# Patient Record
Sex: Female | Born: 1980 | Hispanic: No | Marital: Married | Smoking: Never smoker
Health system: Southern US, Community
[De-identification: ages and names within clinical notes are randomized; demographics above are authoritative.]

## PROBLEM LIST (undated history)

## (undated) DIAGNOSIS — I10 Essential (primary) hypertension: Secondary | ICD-10-CM

## (undated) DIAGNOSIS — Z87442 Personal history of urinary calculi: Secondary | ICD-10-CM

## (undated) DIAGNOSIS — Z973 Presence of spectacles and contact lenses: Secondary | ICD-10-CM

## (undated) DIAGNOSIS — D649 Anemia, unspecified: Secondary | ICD-10-CM

## (undated) DIAGNOSIS — N200 Calculus of kidney: Secondary | ICD-10-CM

## (undated) HISTORY — DX: Essential (primary) hypertension: I10

---

## 1999-03-26 ENCOUNTER — Other Ambulatory Visit: Admission: RE | Admit: 1999-03-26 | Discharge: 1999-03-26 | Payer: Self-pay | Admitting: Obstetrics & Gynecology

## 1999-08-10 ENCOUNTER — Encounter: Payer: Self-pay | Admitting: *Deleted

## 1999-08-10 ENCOUNTER — Inpatient Hospital Stay (HOSPITAL_COMMUNITY): Admission: AD | Admit: 1999-08-10 | Discharge: 1999-08-10 | Payer: Self-pay | Admitting: *Deleted

## 1999-08-12 ENCOUNTER — Inpatient Hospital Stay (HOSPITAL_COMMUNITY): Admission: AD | Admit: 1999-08-12 | Discharge: 1999-08-12 | Payer: Self-pay | Admitting: Obstetrics

## 1999-08-13 ENCOUNTER — Inpatient Hospital Stay (HOSPITAL_COMMUNITY): Admission: AD | Admit: 1999-08-13 | Discharge: 1999-08-16 | Payer: Self-pay | Admitting: Obstetrics

## 2002-08-04 ENCOUNTER — Emergency Department (HOSPITAL_COMMUNITY): Admission: EM | Admit: 2002-08-04 | Discharge: 2002-08-05 | Payer: Self-pay | Admitting: Emergency Medicine

## 2002-09-21 ENCOUNTER — Encounter: Admission: RE | Admit: 2002-09-21 | Discharge: 2002-09-21 | Payer: Self-pay | Admitting: *Deleted

## 2002-09-22 ENCOUNTER — Ambulatory Visit (HOSPITAL_COMMUNITY): Admission: RE | Admit: 2002-09-22 | Discharge: 2002-09-22 | Payer: Self-pay | Admitting: *Deleted

## 2002-10-05 ENCOUNTER — Encounter: Admission: RE | Admit: 2002-10-05 | Discharge: 2002-10-05 | Payer: Self-pay | Admitting: *Deleted

## 2002-10-26 ENCOUNTER — Encounter: Admission: RE | Admit: 2002-10-26 | Discharge: 2002-10-26 | Payer: Self-pay | Admitting: *Deleted

## 2002-11-16 ENCOUNTER — Encounter: Admission: RE | Admit: 2002-11-16 | Discharge: 2002-11-16 | Payer: Self-pay | Admitting: *Deleted

## 2002-11-16 ENCOUNTER — Inpatient Hospital Stay (HOSPITAL_COMMUNITY): Admission: AD | Admit: 2002-11-16 | Discharge: 2002-11-18 | Payer: Self-pay | Admitting: *Deleted

## 2002-11-19 ENCOUNTER — Inpatient Hospital Stay (HOSPITAL_COMMUNITY): Admission: AD | Admit: 2002-11-19 | Discharge: 2002-11-19 | Payer: Self-pay | Admitting: Obstetrics & Gynecology

## 2002-11-21 ENCOUNTER — Encounter: Admission: RE | Admit: 2002-11-21 | Discharge: 2002-12-21 | Payer: Self-pay | Admitting: *Deleted

## 2007-01-21 ENCOUNTER — Emergency Department (HOSPITAL_COMMUNITY): Admission: EM | Admit: 2007-01-21 | Discharge: 2007-01-21 | Payer: Self-pay | Admitting: Emergency Medicine

## 2010-12-04 LAB — URINALYSIS, ROUTINE W REFLEX MICROSCOPIC
Glucose, UA: NEGATIVE
Ketones, ur: NEGATIVE
pH: 6

## 2010-12-04 LAB — URINE MICROSCOPIC-ADD ON

## 2010-12-04 LAB — POCT PREGNANCY, URINE: Operator id: 27065

## 2010-12-04 LAB — URINE CULTURE

## 2011-12-07 ENCOUNTER — Ambulatory Visit (INDEPENDENT_AMBULATORY_CARE_PROVIDER_SITE_OTHER): Payer: PRIVATE HEALTH INSURANCE | Admitting: Emergency Medicine

## 2011-12-07 VITALS — BP 120/80 | HR 104 | Temp 97.4°F | Resp 16 | Ht 62.0 in | Wt 158.0 lb

## 2011-12-07 DIAGNOSIS — G5600 Carpal tunnel syndrome, unspecified upper limb: Secondary | ICD-10-CM

## 2011-12-07 DIAGNOSIS — R350 Frequency of micturition: Secondary | ICD-10-CM

## 2011-12-07 DIAGNOSIS — N390 Urinary tract infection, site not specified: Secondary | ICD-10-CM

## 2011-12-07 DIAGNOSIS — M549 Dorsalgia, unspecified: Secondary | ICD-10-CM

## 2011-12-07 LAB — POCT UA - MICROSCOPIC ONLY
Casts, Ur, LPF, POC: NEGATIVE
Crystals, Ur, HPF, POC: NEGATIVE
Mucus, UA: POSITIVE
Yeast, UA: NEGATIVE

## 2011-12-07 LAB — POCT URINALYSIS DIPSTICK
Bilirubin, UA: NEGATIVE
Glucose, UA: NEGATIVE
Ketones, UA: NEGATIVE
Nitrite, UA: POSITIVE
Protein, UA: 30
Spec Grav, UA: 1.02
Urobilinogen, UA: 0.2
pH, UA: 7

## 2011-12-07 MED ORDER — MELOXICAM 7.5 MG PO TABS: 7.5000 mg | ORAL_TABLET | Freq: Every day | ORAL | Status: DC

## 2011-12-07 MED ORDER — SULFAMETHOXAZOLE-TRIMETHOPRIM 800-160 MG PO TABS: 1.0000 | ORAL_TABLET | Freq: Two times a day (BID) | ORAL | Status: DC

## 2011-12-07 MED ORDER — PHENAZOPYRIDINE HCL 200 MG PO TABS: 200.0000 mg | ORAL_TABLET | Freq: Three times a day (TID) | ORAL | Status: AC | PRN

## 2011-12-07 NOTE — Patient Instructions (Signed)

## 2011-12-07 NOTE — Progress Notes (Signed)
[facility] [address], [place] [address] [phone]  Date:  12/07/2011   Name:  Erica [name]   DOB:  1981/01/08   MRN:  [id number]  PCP:  No primary provider on file.    Chief Complaint: Headache, Numbness, Back Pain and Urinary Frequency   History of Present Illness:  Erica [name] is a 31 y.o. very pleasant female patient who presents with the following 1. Bilateral hands numb, numbness wakes her up at night worse thumb index and middle fingers also present during the day at rest and while operating machinery.   2. Urinary frequency, dysuria, urgency, and back pain   There is no problem list on file for this patient.   No past medical history on file.  No past surgical history on file.  History  Substance Use Topics  . Smoking status: Never Smoker   . Smokeless tobacco: Not on file  . Alcohol Use: Not on file    No family history on file.  No Known Allergies  Medication list has been reviewed and updated.  No current outpatient prescriptions on file prior to visit.    Review of Systems:  As per HPI, otherwise negative.    Physical Examination: Filed Vitals:   12/07/11 1243  BP: 120/80  Pulse: 104  Temp: 97.4 F (36.3 C)  Resp: 16   Filed Vitals:   12/07/11 1243  Height: 5\' 2"  (1.575 m)  Weight: 158 lb (71.668 kg)   Body mass index is 28.90 kg/(m^2). Ideal Body Weight: Weight in (lb) to have BMI = 25: 136.4   GEN: WDWN, NAD, Non-toxic, Alert & Oriented x 3 HEENT: Atraumatic, Normocephalic.  Ears and Nose: No external deformity. EXTR: No clubbing/cyanosis/edema NEURO: Normal gait.  PSYCH: Normally interactive. Conversant. Not depressed or anxious appearing.  Calm demeanor.  Hands:  Positive tinels and phalens bilateral wrist, no thenar wasting normal grip strength. Urinary frequency and back pain   Assessment and Plan: Carpal tunnel syndrome use night splints take Meloxicam if not better proceed with  nerve studies  Cystitis Septra pyridium  Results for orders placed in visit on 12/07/11  POCT URINALYSIS DIPSTICK      Component Value Range   Color, UA yellow     Clarity, UA cloudy     Glucose, UA neg     Bilirubin, UA neg     Ketones, UA neg     Spec Grav, UA 1.020     Blood, UA mod     pH, UA 7.0     Protein, UA 30     Urobilinogen, UA 0.2     Nitrite, UA pos     Leukocytes, UA moderate (2+)    POCT UA - MICROSCOPIC ONLY      Component Value Range   WBC, Ur, HPF, POC tntc     RBC, urine, microscopic tntc     Bacteria, U Microscopic 4+     Mucus, UA pos     Epithelial cells, urine per micros 4-7     Crystals, Ur, HPF, POC neg     Casts, Ur, LPF, POC neg     Yeast, UA neg       Littrell, Amy Wall, RT as scribe for Dr. Dareen Piano.  I have reviewed and agree with documentation. Robert P. Merla Riches, M.D.

## 2014-10-04 LAB — CYTOLOGY - PAP: Pap: NEGATIVE

## 2016-06-03 ENCOUNTER — Ambulatory Visit (INDEPENDENT_AMBULATORY_CARE_PROVIDER_SITE_OTHER): Payer: Managed Care, Other (non HMO) | Admitting: Family Medicine

## 2016-06-03 ENCOUNTER — Encounter: Payer: Self-pay | Admitting: Family Medicine

## 2016-06-03 ENCOUNTER — Encounter (HOSPITAL_COMMUNITY): Payer: Self-pay

## 2016-06-03 VITALS — BP 125/79 | HR 84 | Ht 66.0 in | Wt 165.1 lb

## 2016-06-03 DIAGNOSIS — Z302 Encounter for sterilization: Secondary | ICD-10-CM | POA: Diagnosis not present

## 2016-06-03 NOTE — Patient Instructions (Signed)
Laparoscopic Tubal Ligation Laparoscopic tubal ligation is a procedure to close the fallopian tubes. This is done so that you cannot get pregnant. When the fallopian tubes are closed, the eggs that your ovaries release cannot enter the uterus, and sperm cannot reach the released eggs. A laparoscopic tubal ligation is sometimes called "getting your tubes tied." You should not have this procedure if you want to get pregnant someday or if you are unsure about having more children. Tell a health care provider about:  Any allergies you have.  All medicines you are taking, including vitamins, herbs, eye drops, creams, and over-the-counter medicines.  Any problems you or family members have had with anesthetic medicines.  Any blood disorders you have.  Any surgeries you have had.  Any medical conditions you have.  Whether you are pregnant or may be pregnant.  Any past pregnancies. What are the risks? Generally, this is a safe procedure. However, problems may occur, including:  Infection.  Bleeding.  Injury to surrounding organs.  Side effects from anesthetics.  Failure of the procedure. This procedure can increase your risk of a kind of pregnancy in which a fertilized egg attaches to the outside of the uterus (ectopic pregnancy). What happens before the procedure?  Ask your health care provider about:  Changing or stopping your regular medicines. This is especially important if you are taking diabetes medicines or blood thinners.  Taking medicines such as aspirin and ibuprofen. These medicines can thin your blood. Do not take these medicines before your procedure if your health care provider instructs you not to.  Follow instructions from your health care provider about eating and drinking restrictions.  Plan to have someone take you home after the procedure.  If you go home right after the procedure, plan to have someone with you for 24 hours. What happens during the  procedure?  You will be given one or more of the following:  A medicine to help you relax (sedative).  A medicine to numb the area (local anesthetic).  A medicine to make you fall asleep (general anesthetic).  A medicine that is injected into an area of your body to numb everything below the injection site (regional anesthetic).  An IV tube will be inserted into one of your veins. It will be used to give you medicines and fluids during the procedure.  Your bladder may be emptied with a small tube (catheter).  If you have been given a general anesthetic, a tube will be put down your throat to help you breathe.  Two small cuts (incisions) will be made in your lower abdomen and near your belly button.  Your abdomen will be inflated with a gas. This will let the surgeon see better and will give the surgeon room to work.  A thin, lighted tube (laparoscope) with a camera attached will be inserted into your abdomen through one of the incisions. Small instruments will be inserted through the other incision.  The fallopian tubes will be tied off, burned (cauterized), or blocked with a clip, ring, or clamp. A small portion in the center of each fallopian tube may be removed.  The gas will be released from the abdomen.  The incisions will be closed with stitches (sutures).  A bandage (dressing) will be placed over the incisions. The procedure may vary among health care providers and hospitals. What happens after the procedure?  Your blood pressure, heart rate, breathing rate, and blood oxygen level will be monitored often until the medicines you were   given have worn off.  You will be given medicine to help with pain, nausea, and vomiting as needed. This information is not intended to replace advice given to you by your health care provider. Make sure you discuss any questions you have with your health care provider. Document Released: 05/20/2000 Document Revised: 07/20/2015 Document  Reviewed: 01/22/2015 Elsevier Interactive Patient Education  2017 Elsevier Inc.  

## 2016-06-03 NOTE — Progress Notes (Signed)
   Subjective:    Patient ID: Erica [name] is a 36 y.o. female presenting with Contraception  on 06/03/2016  HPI: Patient is referred for permanent sterilization. Options of LARCs reviewed and patient does desire permanent sterilization. Past Medical History:  Diagnosis Date  . Hypertension    History reviewed. No pertinent surgical history. No current outpatient prescriptions on file prior to visit.   No current facility-administered medications on file prior to visit.    No Known Allergies Social History   Social History  . Marital status: Married    Spouse name: N/A  . Number of children: N/A  . Years of education: N/A   Occupational History  . Not on file.   Social History Main Topics  . Smoking status: Never Smoker  . Smokeless tobacco: Never Used  . Alcohol use No  . Drug use: No  . Sexual activity: Yes    Birth control/ protection: Injection   Other Topics Concern  . Not on file   Social History Narrative  . No narrative on file   History reviewed. No pertinent family history.   Review of Systems  Constitutional: Negative for chills and fever.  Respiratory: Negative for shortness of breath.   Cardiovascular: Negative for chest pain.  Gastrointestinal: Negative for abdominal pain, nausea and vomiting.  Genitourinary: Negative for dysuria.  Skin: Negative for rash.      Objective:    BP 125/79   Pulse 84   Ht  (1.676 m)   Wt 165 lb 1.6 oz (74.9 kg)   BMI 26.65 kg/m  Physical Exam  Constitutional: She is oriented to person, place, and time. She appears well-developed and well-nourished. No distress.  HENT:  Head: Normocephalic and atraumatic.  Eyes: No scleral icterus.  Neck: Neck supple.  Cardiovascular: Normal rate.   Pulmonary/Chest: Effort normal.  Abdominal: Soft.  Neurological: She is alert and oriented to person, place, and time.  Skin: Skin is warm and dry.  Psychiatric: She has a normal mood and affect.          Assessment & Plan:  Encounter for sterilization - For lap sterilization--discussed getting salpingectomy for ovarian cancer prevention if insurance covers, she prefers this method if possible.  Risks include but are not limited to bleeding, infection, injury to surrounding structures, including bowel, bladder and ureters, blood clots, and death.  Likelihood of success is high.  Patient counseled, r.e. Risks benefits of BTL, including permanency of procedure, risk of failure(1:100), increased risk of ectopic.  Patient verbalized understanding and desires to proceed  Total face-to-face time with patient: 25 minutes. Over 50% of encounter was spent on counseling and coordination of care. Return in about 8 weeks (around 07/29/2016) for postop check.  Reva Bores 06/03/2016 3:53 PM

## 2016-07-30 DIAGNOSIS — Z302 Encounter for sterilization: Secondary | ICD-10-CM

## 2016-07-30 NOTE — H&P (Signed)
  Erica [name] is an 36 y.o. [phone] female.   Chief Complaint: desires sterility HPI: for permanent sterilization, desires salpingectomy as form of sterilization, due to decreasing risk of ovarian cancer.  Past Medical History:  Diagnosis Date  . Hypertension     No past surgical history on file.  No family history on file. Social History:  reports that she has never smoked. She has never used smokeless tobacco. She reports that she does not drink alcohol or use drugs.  Allergies: No Known Allergies  No current facility-administered medications on file prior to encounter.    No current outpatient prescriptions on file prior to encounter.    A comprehensive review of systems was negative.  There were no vitals taken for this visit. General appearance: alert, cooperative and appears stated age Head: Normocephalic, without obvious abnormality, atraumatic Neck: supple, symmetrical, trachea midline Lungs: normal effort Heart: regular rate and rhythm Abdomen: soft, non-tender; bowel sounds normal; no masses,  no organomegaly Extremities: extremities normal, atraumatic, no cyanosis or edema Skin: Skin color, texture, turgor normal. No rashes or lesions Neurologic: Grossly normal   No results found for: WBC, HGB, HCT, MCV, PLT No results found for: PREGTESTUR, PREGSERUM, HCG, HCGQUANT   Assessment/Plan Principal Problem:   Encounter for sterilization  For laparoscopic salpingectomy Risks include but are not limited to bleeding, infection, injury to surrounding structures, including bowel, bladder and ureters, blood clots, and death.  Likelihood of success is high.    Erica [name] 07/30/2016, 9:01 AM

## 2016-08-02 NOTE — Patient Instructions (Addendum)
Your procedure is scheduled on:  Monday, June 18  [facility] through the [facility]tr[facility] at: 8:10 am  Pick up the phone at the desk and dial [phone].  Call this number if you have problems the morning of surgery: [phone].  Remember: Do NOT eat or drink (including water) after midnight Sunday  Take these medicines the morning of surgery with a SIP OF WATER:  None  Do not smoke on day of surgery.  Stop all herbal medications at this time.  Do NOT wear jewelry (body piercing), metal hair clips/bobby pins, make-up, or nail polish. Do NOT wear lotions, powders, or perfumes.  You may wear deoderant. Do NOT shave for 48 hours prior to surgery. Do NOT bring valuables to the hospital..  Have a responsible adult drive you home and stay with you for 24 hours after your procedure. Home with -to be arranged prior to surgery.  Patient informed can not use uber, taxi or bus to go home.

## 2016-08-07 ENCOUNTER — Encounter (HOSPITAL_COMMUNITY)
Admission: RE | Admit: 2016-08-07 | Discharge: 2016-08-07 | Disposition: A | Discharge status: Home / Self Care | Payer: Managed Care, Other (non HMO) | Source: Ambulatory Visit | Attending: Family Medicine | Admitting: Family Medicine

## 2016-08-07 ENCOUNTER — Encounter (HOSPITAL_COMMUNITY): Payer: Self-pay

## 2016-08-07 DIAGNOSIS — Z01812 Encounter for preprocedural laboratory examination: Secondary | ICD-10-CM | POA: Insufficient documentation

## 2016-08-07 HISTORY — DX: Anemia, unspecified: D64.9

## 2016-08-07 LAB — CBC
HCT: 35.2 % — ABNORMAL LOW (ref 36.0–46.0)
Hemoglobin: 11.6 g/dL — ABNORMAL LOW (ref 12.0–15.0)
MCH: 25.7 pg — ABNORMAL LOW (ref 26.0–34.0)
MCHC: 33 g/dL (ref 30.0–36.0)
MCV: 78 fL (ref 78.0–100.0)
PLATELETS: 262 10*3/uL (ref 150–400)
RBC: 4.51 MIL/uL (ref 3.87–5.11)
RDW: 13.4 % (ref 11.5–15.5)
WBC: 6.6 10*3/uL (ref 4.0–10.5)

## 2016-08-12 ENCOUNTER — Ambulatory Visit (HOSPITAL_COMMUNITY): Payer: Managed Care, Other (non HMO) | Admitting: Anesthesiology

## 2016-08-12 ENCOUNTER — Encounter: Payer: Self-pay | Admitting: *Deleted

## 2016-08-12 ENCOUNTER — Encounter (HOSPITAL_COMMUNITY): Payer: Self-pay

## 2016-08-12 ENCOUNTER — Ambulatory Visit (HOSPITAL_COMMUNITY)
Admission: RE | Admit: 2016-08-12 | Discharge: 2016-08-12 | Disposition: A | Discharge status: Home / Self Care | Payer: Managed Care, Other (non HMO) | Source: Ambulatory Visit | Attending: Family Medicine | Admitting: Family Medicine

## 2016-08-12 ENCOUNTER — Encounter (HOSPITAL_COMMUNITY)
Admission: RE | Disposition: A | Discharge status: Home / Self Care | Payer: Self-pay | Source: Ambulatory Visit | Attending: Family Medicine

## 2016-08-12 DIAGNOSIS — Z302 Encounter for sterilization: Secondary | ICD-10-CM | POA: Diagnosis not present

## 2016-08-12 HISTORY — PX: LAPAROSCOPIC BILATERAL SALPINGECTOMY: SHX5889

## 2016-08-12 LAB — PREGNANCY, URINE: Preg Test, Ur: NEGATIVE

## 2016-08-12 SURGERY — SALPINGECTOMY, BILATERAL, LAPAROSCOPIC
Anesthesia: General | Site: Abdomen | Laterality: Bilateral

## 2016-08-12 MED ORDER — LACTATED RINGERS IV SOLN
INTRAVENOUS | Status: DC
  Administered 2016-08-12: 125 mL/h via INTRAVENOUS
  Administered 2016-08-12: 10:00:00 via INTRAVENOUS

## 2016-08-12 MED ORDER — ROCURONIUM BROMIDE 100 MG/10ML IV SOLN
INTRAVENOUS | Status: DC | PRN
  Administered 2016-08-12: 50 mg via INTRAVENOUS

## 2016-08-12 MED ORDER — PROPOFOL 10 MG/ML IV BOLUS
INTRAVENOUS | Status: AC
  Filled 2016-08-12: qty 20

## 2016-08-12 MED ORDER — ONDANSETRON HCL 4 MG/2ML IJ SOLN
INTRAMUSCULAR | Status: DC | PRN
  Administered 2016-08-12: 4 mg via INTRAVENOUS

## 2016-08-12 MED ORDER — LIDOCAINE HCL (CARDIAC) 20 MG/ML IV SOLN
INTRAVENOUS | Status: AC
  Filled 2016-08-12: qty 5

## 2016-08-12 MED ORDER — BUPIVACAINE HCL (PF) 0.25 % IJ SOLN
INTRAMUSCULAR | Status: DC | PRN
  Administered 2016-08-12: 10 mL

## 2016-08-12 MED ORDER — MIDAZOLAM HCL 2 MG/2ML IJ SOLN
INTRAMUSCULAR | Status: DC | PRN
  Administered 2016-08-12: 2 mg via INTRAVENOUS

## 2016-08-12 MED ORDER — FENTANYL CITRATE (PF) 100 MCG/2ML IJ SOLN
INTRAMUSCULAR | Status: DC | PRN
  Administered 2016-08-12: 150 ug via INTRAVENOUS
  Administered 2016-08-12: 100 ug via INTRAVENOUS

## 2016-08-12 MED ORDER — MEPERIDINE HCL 25 MG/ML IJ SOLN: 6.2500 mg | INTRAMUSCULAR | Status: DC | PRN

## 2016-08-12 MED ORDER — SUGAMMADEX SODIUM 200 MG/2ML IV SOLN
INTRAVENOUS | Status: AC
Start: 2016-08-12 — End: ?
  Filled 2016-08-12: qty 2

## 2016-08-12 MED ORDER — LACTATED RINGERS IV SOLN: INTRAVENOUS | Status: DC

## 2016-08-12 MED ORDER — FENTANYL CITRATE (PF) 250 MCG/5ML IJ SOLN
INTRAMUSCULAR | Status: AC
  Filled 2016-08-12: qty 5

## 2016-08-12 MED ORDER — SUGAMMADEX SODIUM 200 MG/2ML IV SOLN
INTRAVENOUS | Status: DC | PRN
  Administered 2016-08-12: 200 mg via INTRAVENOUS

## 2016-08-12 MED ORDER — OXYCODONE-ACETAMINOPHEN 5-325 MG PO TABS: 1.0000 | ORAL_TABLET | Freq: Four times a day (QID) | ORAL | 0 refills | Status: DC | PRN

## 2016-08-12 MED ORDER — DEXAMETHASONE SODIUM PHOSPHATE 10 MG/ML IJ SOLN
INTRAMUSCULAR | Status: DC | PRN
  Administered 2016-08-12: 4 mg via INTRAVENOUS

## 2016-08-12 MED ORDER — SCOPOLAMINE 1 MG/3DAYS TD PT72
MEDICATED_PATCH | TRANSDERMAL | Status: AC
  Administered 2016-08-12: 1.5 mg via TRANSDERMAL
  Filled 2016-08-12: qty 1

## 2016-08-12 MED ORDER — FENTANYL CITRATE (PF) 100 MCG/2ML IJ SOLN: 25.0000 ug | INTRAMUSCULAR | Status: DC | PRN

## 2016-08-12 MED ORDER — IBUPROFEN 600 MG PO TABS: 600.0000 mg | ORAL_TABLET | Freq: Four times a day (QID) | ORAL | 1 refills | Status: DC | PRN

## 2016-08-12 MED ORDER — ONDANSETRON HCL 4 MG/2ML IJ SOLN
INTRAMUSCULAR | Status: AC
  Filled 2016-08-12: qty 2

## 2016-08-12 MED ORDER — KETOROLAC TROMETHAMINE 30 MG/ML IJ SOLN
INTRAMUSCULAR | Status: DC | PRN
  Administered 2016-08-12: 30 mg via INTRAVENOUS

## 2016-08-12 MED ORDER — SCOPOLAMINE 1 MG/3DAYS TD PT72
1.0000 | MEDICATED_PATCH | Freq: Once | TRANSDERMAL | Status: DC
  Administered 2016-08-12: 1.5 mg via TRANSDERMAL

## 2016-08-12 MED ORDER — PROPOFOL 10 MG/ML IV BOLUS
INTRAVENOUS | Status: DC | PRN
  Administered 2016-08-12: 150 mg via INTRAVENOUS

## 2016-08-12 MED ORDER — LIDOCAINE HCL (CARDIAC) 20 MG/ML IV SOLN
INTRAVENOUS | Status: DC | PRN
  Administered 2016-08-12: 50 mg via INTRAVENOUS

## 2016-08-12 MED ORDER — KETOROLAC TROMETHAMINE 30 MG/ML IJ SOLN
INTRAMUSCULAR | Status: AC
  Filled 2016-08-12: qty 1

## 2016-08-12 MED ORDER — BUPIVACAINE HCL (PF) 0.25 % IJ SOLN
INTRAMUSCULAR | Status: AC
  Filled 2016-08-12: qty 30

## 2016-08-12 MED ORDER — DEXAMETHASONE SODIUM PHOSPHATE 4 MG/ML IJ SOLN
INTRAMUSCULAR | Status: AC
  Filled 2016-08-12: qty 1

## 2016-08-12 MED ORDER — MIDAZOLAM HCL 2 MG/2ML IJ SOLN
INTRAMUSCULAR | Status: AC
  Filled 2016-08-12: qty 2

## 2016-08-12 MED ORDER — METOCLOPRAMIDE HCL 5 MG/ML IJ SOLN: 10.0000 mg | Freq: Once | INTRAMUSCULAR | Status: DC | PRN

## 2016-08-12 MED ORDER — FENTANYL CITRATE (PF) 100 MCG/2ML IJ SOLN
INTRAMUSCULAR | Status: AC
  Filled 2016-08-12: qty 2

## 2016-08-12 SURGICAL SUPPLY — 32 items
APPLICATOR COTTON TIP 6IN STRL (MISCELLANEOUS) ×2 IMPLANT
BAG SPEC RTRVL LRG 6X4 10 (ENDOMECHANICALS)
BLADE SURG 15 STRL LF C SS BP (BLADE) ×1 IMPLANT
BLADE SURG 15 STRL SS (BLADE) ×3
CABLE HIGH FREQUENCY MONO STRZ (ELECTRODE) IMPLANT
CATH ROBINSON RED A/P 16FR (CATHETERS) ×2 IMPLANT
CLOTH BEACON ORANGE TIMEOUT ST (SAFETY) ×3 IMPLANT
DRSG OPSITE POSTOP 3X4 (GAUZE/BANDAGES/DRESSINGS) IMPLANT
DURAPREP 26ML APPLICATOR (WOUND CARE) ×3 IMPLANT
GLOVE BIOGEL PI IND STRL 7.0 (GLOVE) ×4 IMPLANT
GLOVE BIOGEL PI INDICATOR 7.0 (GLOVE) ×8
GLOVE ECLIPSE 7.0 STRL STRAW (GLOVE) ×3 IMPLANT
GOWN STRL REUS W/TWL LRG LVL3 (GOWN DISPOSABLE) ×9 IMPLANT
NS IRRIG 1000ML POUR BTL (IV SOLUTION) ×3 IMPLANT
PACK LAPAROSCOPY BASIN (CUSTOM PROCEDURE TRAY) ×3 IMPLANT
PACK TRENDGUARD 450 HYBRID PRO (MISCELLANEOUS) IMPLANT
PACK TRENDGUARD 600 HYBRD PROC (MISCELLANEOUS) IMPLANT
POUCH SPECIMEN RETRIEVAL 10MM (ENDOMECHANICALS) IMPLANT
PROTECTOR NERVE ULNAR (MISCELLANEOUS) ×6 IMPLANT
SET IRRIG TUBING LAPAROSCOPIC (IRRIGATION / IRRIGATOR) IMPLANT
SHEARS HARMONIC ACE PLUS 36CM (ENDOMECHANICALS) ×2 IMPLANT
SLEEVE XCEL OPT CAN 5 100 (ENDOMECHANICALS) ×3 IMPLANT
SUT VIC AB 3-0 X1 27 (SUTURE) ×3 IMPLANT
SUT VICRYL 0 UR6 27IN ABS (SUTURE) ×6 IMPLANT
TOWEL OR 17X24 6PK STRL BLUE (TOWEL DISPOSABLE) ×6 IMPLANT
TRAY FOLEY CATH SILVER 14FR (SET/KITS/TRAYS/PACK) ×3 IMPLANT
TRENDGUARD 450 HYBRID PRO PACK (MISCELLANEOUS)
TRENDGUARD 600 HYBRID PROC PK (MISCELLANEOUS)
TROCAR BALLN 12MMX100 BLUNT (TROCAR) IMPLANT
TROCAR XCEL NON-BLD 5MMX100MML (ENDOMECHANICALS) ×3 IMPLANT
TUBING INSUFFLATION 10FT LAP (TUBING) ×2 IMPLANT
WARMER LAPAROSCOPE (MISCELLANEOUS) ×3 IMPLANT

## 2016-08-12 NOTE — Transfer of Care (Signed)
Immediate Anesthesia Transfer of Care Note  Patient: Erica [name]  Procedure(s) Performed: Procedure(s): LAPAROSCOPIC BILATERAL SALPINGECTOMY (Bilateral)  Patient Location: PACU  Anesthesia Type:General  Level of Consciousness: awake, alert  and oriented  Airway & Oxygen Therapy: Patient Spontanous Breathing and Patient connected to nasal cannula oxygen  Post-op Assessment: Report given to RN and Post -op Vital signs reviewed and stable  Post vital signs: Reviewed and stable  Last Vitals:  Vitals:   08/12/16 0811  BP: (!) 130/100  Pulse: 90  Resp: 16  Temp: 36.9 C    Last Pain:  Vitals:   08/12/16 0811  TempSrc: Oral      Patients Stated Pain Goal: 4 (08/12/16 0811)  Complications: No apparent anesthesia complications

## 2016-08-12 NOTE — Progress Notes (Unsigned)
Per [employer], no prior authorization is required for cpt #58700.

## 2016-08-12 NOTE — Discharge Instructions (Signed)
DISCHARGE INSTRUCTIONS: Laparoscopy  The following instructions have been prepared to help you care for yourself upon your return home today.  Wound care:  Do not get the incision wet for the first 24 hours. The incision should be kept clean and dry.  The Band-Aids or dressings may be removed the day after surgery.  Should the incision become sore, red, and swollen after the first week, check with your doctor.  Personal hygiene:  Shower the day after your procedure.  Activity and limitations:  Do NOT drive or operate any equipment today.  Do NOT lift anything more than 15 pounds for 2-3 weeks after surgery.  Do NOT rest in bed all day.  Walking is encouraged. Walk each day, starting slowly with 5-minute walks 3 or 4 times a day. Slowly increase the length of your walks.  Walk up and down stairs slowly.  Do NOT do strenuous activities, such as golfing, playing tennis, bowling, running, biking, weight lifting, gardening, mowing, or vacuuming for 2-4 weeks. Ask your doctor when it is okay to start.  Diet: Eat a light meal as desired this evening. You may resume your usual diet tomorrow.  Return to work: This is dependent on the type of work you do. For the most part you can return to a desk job within a week of surgery. If you are more active at work, please discuss this with your doctor.  What to expect after your surgery: You may have a slight burning sensation when you urinate on the first day. You may have a very small amount of blood in the urine. Expect to have a small amount of vaginal discharge/light bleeding for 1-2 weeks. It is not unusual to have abdominal soreness and bruising for up to 2 weeks. You may be tired and need more rest for about 1 week. You may experience shoulder pain for 24-72 hours. Lying flat in bed may relieve it.  Call your doctor for any of the following:  Develop a fever of 100.4 or greater  Inability to urinate 6 hours after discharge from  hospital  Severe pain not relieved by pain medications  Persistent of heavy bleeding at incision site  Redness or swelling around incision site after a week  Increasing nausea or vomiting  Patient Signature________________________________________ Nurse Signature_________________________________________Laparoscopic Tubal Ligation, Care After Refer to this sheet in the next few weeks. These instructions provide you with information about caring for yourself after your procedure. Your health care provider may also give you more specific instructions. Your treatment has been planned according to current medical practices, but problems sometimes occur. Call your health care provider if you have any problems or questions after your procedure. What can I expect after the procedure? After the procedure, it is common to have:  A sore throat.  Discomfort in your shoulder.  Mild discomfort or cramping in your abdomen.  Gas pains.  Pain or soreness in the area where the surgical cut (incision) was made.  A bloated feeling.  Tiredness.  Nausea.  Vomiting.  Follow these instructions at home: Medicines  Take over-the-counter and prescription medicines only as told by your health care provider.  Do not take aspirin because it can cause bleeding.  Do not drive or operate heavy machinery while taking prescription pain medicine-Percocet. Activity  Rest for the rest of the day.  Return to your normal activities as told by your health care provider in 2-3 days as you feel up to it. Ask your health care provider what  activities are safe for you. Incision care   Follow instructions from your health care provider about how to take care of your incision. Make sure you: ? Wash your hands with soap and water before you change your bandage (dressing). If soap and water are not available, use hand sanitizer. ? Change your dressing as told by your health care provider. ? Leave stitches  (sutures) in place. They may need to stay in place for 2 weeks or longer.  Check your incision area every day for signs of infection. Check for: ? More redness, swelling, or pain. ? More fluid or blood. ? Warmth. ? Pus or a bad smell. Other Instructions  Do not take baths, swim, or use a hot tub until your health care provider approves. You may take showers.  Keep all follow-up visits as told by your health care provider. This is important.  Have someone help you with your daily household tasks for the first few days. Contact a health care provider if:  You have more redness, swelling, or pain around your incision.  Your incision feels warm to the touch.  You have pus or a bad smell coming from your incision.  The edges of your incision break open after the sutures have been removed.  Your pain does not improve after 2-3 days.  You have a rash.  You repeatedly become dizzy or light-headed.  Your pain medicine is not helping.  You are constipated. Get help right away if:  You have a fever.  You faint.  You have increasing pain in your abdomen.  You have severe pain in one or both of your shoulders.  You have fluid or blood coming from your sutures or from your vagina.  You have shortness of breath or difficulty breathing.  You have chest pain or leg pain.  You have ongoing nausea, vomiting, or diarrhea. This information is not intended to replace advice given to you by your health care provider. Make sure you discuss any questions you have with your health care provider. Document Released: 08/31/2004 Document Revised: 07/17/2015 Document Reviewed: 01/22/2015 Elsevier Interactive Patient Education  [employer]2018 Elsevier Inc.

## 2016-08-12 NOTE — Anesthesia Procedure Notes (Signed)
Procedure Name: Intubation Date/Time: 08/12/2016 9:52 AM Performed by: Shanon PayorGREGORY, Ariah Mower M Pre-anesthesia Checklist: Patient identified, Emergency Drugs available, Suction available, Patient being monitored and Timeout performed Patient Re-evaluated:Patient Re-evaluated prior to inductionOxygen Delivery Method: Circle system utilized Preoxygenation: Pre-oxygenation with 100% oxygen Intubation Type: IV induction Ventilation: Mask ventilation without difficulty Grade View: Grade I Tube type: Oral Tube size: 7.0 mm Number of attempts: 1 Airway Equipment and Method: Stylet Placement Confirmation: ETT inserted through vocal cords under direct vision,  positive ETCO2 and breath sounds checked- equal and bilateral Secured at: 22 cm Tube secured with: Tape Dental Injury: Teeth and Oropharynx as per pre-operative assessment

## 2016-08-12 NOTE — Op Note (Signed)
PROCEDURE DATE: 08/12/2016  PREOPERATIVE DIAGNOSES: Undesired fertility  POSTOPERATIVE DIAGNOSES: The same   PROCEDURE: Laparoscopic bilateral salpingctomy   SURGEON: Dr. Reva Boresanya S Azlee Monforte   ASSISTANT: None  ANESTHESIOLOGIST: Phillips Groutarignan, Peter, MD MD - GETT  INDICATIONS: 36 y.o. [phone] [id number] who desired permanent sterilization.  FINDINGS: Normal appearing uterus, tubes and ovaries   ESTIMATED BLOOD LOSS: 25 ml   SPECIMENS: Bilateral fallopian tubes to pathology  COMPLICATIONS: None immediately known   PROCEDURE IN DETAIL: The patient had sequential compression devices applied to her lower extremities while in the preoperative area. She was then taken to the operating room where general anesthesia was administered and was found to be adequate. She was placed in the dorsal lithotomy position, and was prepped and draped in a sterile manner. A Red rubber catheter was inserted into her bladder and drained a clear unknown amount of urine. A speculum was placed inside the vagina, and the cervix grasped with an single tooth tenaculum. A Hulka tenaculum was placed through the cervix for uterine manipulation. Attention was then turned to the patient's abdomen where a 11-mm skin incision was made in the umbilicus.  This was carried down to the underlying fascia and peritoneum.  The fascia was tagged with 0 Vicryl suture on a UR-6. Intraperitoneal placement was confirmed and insufflation done. A survey of the patient's pelvis and abdomen revealed the findings above. Two left sided 5-mm lower quadrant ports were then placed under direct visualization. On the rightt side, the tube was separated from the mesosalpinx with the Harmonic device.  On the left side, the tube was separated from the mesosalpinx with the Harmonic device.  The specimen was then removed from the abdomen through the 11-mm port, under direct visualization. The operative site was surveyed, and found to be hemostatic. No intraoperative injury to  other surrounding organs was noted. The abdomen was desufflated and all instruments were then removed from the patient's abdomen. Fascial closure was completed with aforementioned 0 Vicryl on a UR-6. All skin incisions were closed with 3-0 Vicryl subcuticular stitches/Dermabond.   Reva Boresanya S Avneet Ashmore MD 08/12/2016 10:45 AM

## 2016-08-12 NOTE — Anesthesia Postprocedure Evaluation (Signed)
Anesthesia Post Note  Patient: Erica [name]  Procedure(s) Performed: Procedure(s) (LRB): LAPAROSCOPIC BILATERAL SALPINGECTOMY (Bilateral)     Patient location during evaluation: PACU Anesthesia Type: General Level of consciousness: awake Pain management: pain level controlled Vital Signs Assessment: post-procedure vital signs reviewed and stable Respiratory status: spontaneous breathing Cardiovascular status: stable Postop Assessment: no signs of nausea or vomiting Anesthetic complications: no    Last Vitals:  Vitals:   08/12/16 1115 08/12/16 1146  BP: 126/85 124/81  Pulse: 76 86  Resp: 15 16  Temp:      Last Pain:  Vitals:   08/12/16 1146  TempSrc:   PainSc: 3    Pain Goal: Patients Stated Pain Goal: 4 (08/12/16 0811)               Viveca Beckstrom JR,JOHN Susann GivensFRANKLIN

## 2016-08-12 NOTE — Interval H&P Note (Signed)
History and Physical Interval Note:  08/12/2016 9:36 AM  Erica [name]  has presented today for surgery, with the diagnosis of Undesired Fertility  The various methods of treatment have been discussed with the patient and family. After consideration of risks, benefits and other options for treatment, the patient has consented to  Procedure(s): LAPAROSCOPIC BILATERAL SALPINGECTOMY (Bilateral) as a surgical intervention .  The patient's history has been reviewed, patient examined, no change in status, stable for surgery.  I have reviewed the patient's chart and labs.  Questions were answered to the patient's satisfaction.     Reva Boresanya S Charnise Lovan

## 2016-08-12 NOTE — Anesthesia Preprocedure Evaluation (Signed)
Anesthesia Evaluation  Patient identified by MRN, date of birth, ID band Patient awake    Reviewed: Allergy & Precautions, NPO status , Patient's Chart, lab work & pertinent test results  Airway Mallampati: II  TM Distance: >3 FB Neck ROM: Full    Dental no notable dental hx.    Pulmonary neg pulmonary ROS,    Pulmonary exam normal breath sounds clear to auscultation       Cardiovascular negative cardio ROS Normal cardiovascular exam Rhythm:Regular Rate:Normal     Neuro/Psych negative neurological ROS  negative psych ROS   GI/Hepatic negative GI ROS, Neg liver ROS,   Endo/Other  negative endocrine ROS  Renal/GU negative Renal ROS  negative genitourinary   Musculoskeletal negative musculoskeletal ROS (+)   Abdominal   Peds negative pediatric ROS (+)  Hematology negative hematology ROS (+)   Anesthesia Other Findings   Reproductive/Obstetrics negative OB ROS                             Anesthesia Physical Anesthesia Plan  ASA: II  Anesthesia Plan: General   Post-op Pain Management:    Induction: Intravenous  PONV Risk Score and Plan: 3 and Ondansetron, Dexamethasone, Propofol and Midazolam  Airway Management Planned: Oral ETT  Additional Equipment:   Intra-op Plan:   Post-operative Plan: Extubation in OR  Informed Consent: I have reviewed the patients History and Physical, chart, labs and discussed the procedure including the risks, benefits and alternatives for the proposed anesthesia with the patient or authorized representative who has indicated his/her understanding and acceptance.   Dental advisory given  Plan Discussed with: CRNA  Anesthesia Plan Comments:         Anesthesia Quick Evaluation  

## 2016-08-13 ENCOUNTER — Encounter (HOSPITAL_COMMUNITY): Payer: Self-pay | Admitting: Family Medicine

## 2016-08-17 ENCOUNTER — Emergency Department (HOSPITAL_COMMUNITY)
Admission: EM | Admit: 2016-08-17 | Discharge: 2016-08-18 | Disposition: A | Discharge status: Home / Self Care | Payer: Managed Care, Other (non HMO) | Attending: Emergency Medicine | Admitting: Emergency Medicine

## 2016-08-17 ENCOUNTER — Encounter (HOSPITAL_COMMUNITY): Payer: Self-pay

## 2016-08-17 DIAGNOSIS — I10 Essential (primary) hypertension: Secondary | ICD-10-CM | POA: Insufficient documentation

## 2016-08-17 DIAGNOSIS — K8020 Calculus of gallbladder without cholecystitis without obstruction: Secondary | ICD-10-CM | POA: Diagnosis not present

## 2016-08-17 DIAGNOSIS — R1011 Right upper quadrant pain: Secondary | ICD-10-CM

## 2016-08-17 DIAGNOSIS — N200 Calculus of kidney: Secondary | ICD-10-CM | POA: Diagnosis not present

## 2016-08-17 DIAGNOSIS — R1013 Epigastric pain: Secondary | ICD-10-CM | POA: Diagnosis present

## 2016-08-17 DIAGNOSIS — N3000 Acute cystitis without hematuria: Secondary | ICD-10-CM | POA: Diagnosis not present

## 2016-08-17 DIAGNOSIS — Z79899 Other long term (current) drug therapy: Secondary | ICD-10-CM | POA: Insufficient documentation

## 2016-08-17 DIAGNOSIS — R748 Abnormal levels of other serum enzymes: Secondary | ICD-10-CM

## 2016-08-17 LAB — CBC
HCT: 34.8 % — ABNORMAL LOW (ref 36.0–46.0)
HEMOGLOBIN: 11.4 g/dL — AB (ref 12.0–15.0)
MCH: 25.6 pg — AB (ref 26.0–34.0)
MCHC: 32.8 g/dL (ref 30.0–36.0)
MCV: 78 fL (ref 78.0–100.0)
Platelets: 201 10*3/uL (ref 150–400)
RBC: 4.46 MIL/uL (ref 3.87–5.11)
RDW: 13 % (ref 11.5–15.5)
WBC: 11 10*3/uL — ABNORMAL HIGH (ref 4.0–10.5)

## 2016-08-17 LAB — URINALYSIS, ROUTINE W REFLEX MICROSCOPIC
Bilirubin Urine: NEGATIVE
Glucose, UA: NEGATIVE mg/dL
KETONES UR: NEGATIVE mg/dL
Nitrite: NEGATIVE
PROTEIN: 30 mg/dL — AB
Specific Gravity, Urine: 1.017 (ref 1.005–1.030)
pH: 6 (ref 5.0–8.0)

## 2016-08-17 LAB — LIPASE, BLOOD: LIPASE: 28 U/L (ref 11–51)

## 2016-08-17 LAB — COMPREHENSIVE METABOLIC PANEL
ALBUMIN: 3.9 g/dL (ref 3.5–5.0)
ALK PHOS: 73 U/L (ref 38–126)
ALT: 55 U/L — AB (ref 14–54)
ANION GAP: 7 (ref 5–15)
AST: 96 U/L — ABNORMAL HIGH (ref 15–41)
BUN: 14 mg/dL (ref 6–20)
CALCIUM: 8.9 mg/dL (ref 8.9–10.3)
CHLORIDE: 101 mmol/L (ref 101–111)
CO2: 25 mmol/L (ref 22–32)
CREATININE: 0.75 mg/dL (ref 0.44–1.00)
GFR calc non Af Amer: >60 mL/min (ref >60)
GLUCOSE: 151 mg/dL — AB (ref 65–99)
Potassium: 3.6 mmol/L (ref 3.5–5.1)
SODIUM: 133 mmol/L — AB (ref 135–145)
Total Bilirubin: 0.5 mg/dL (ref 0.3–1.2)
Total Protein: 7.5 g/dL (ref 6.5–8.1)

## 2016-08-17 LAB — I-STAT CG4 LACTIC ACID, ED: LACTIC ACID, VENOUS: 1.63 mmol/L (ref 0.5–1.9)

## 2016-08-17 LAB — POC URINE PREG, ED: PREG TEST UR: NEGATIVE

## 2016-08-17 MED ORDER — SODIUM CHLORIDE 0.9 % IV BOLUS (SEPSIS)
1000.0000 mL | Freq: Once | INTRAVENOUS | Status: AC
  Administered 2016-08-18: 1000 mL via INTRAVENOUS

## 2016-08-17 MED ORDER — SODIUM CHLORIDE 0.9 % IV SOLN
1000.0000 mL | INTRAVENOUS | Status: DC
  Administered 2016-08-18 (×2): 1000 mL via INTRAVENOUS

## 2016-08-17 NOTE — ED Triage Notes (Signed)
Pt complaining of upper abdominal pain. Pt states had hysterectomy on Monday. Pt states fever/chills. Incision site is clean and intact, no redness, swelling or drainage noted. Pt denies any N/V/D. Pt states some dysuria.

## 2016-08-18 ENCOUNTER — Emergency Department (HOSPITAL_COMMUNITY): Payer: Managed Care, Other (non HMO)

## 2016-08-18 LAB — I-STAT CG4 LACTIC ACID, ED: Lactic Acid, Venous: 0.68 mmol/L (ref 0.5–1.9)

## 2016-08-18 MED ORDER — CEPHALEXIN 500 MG PO CAPS
500.0000 mg | ORAL_CAPSULE | Freq: Four times a day (QID) | ORAL | 0 refills | Status: DC
Start: 2016-08-18 — End: 2016-08-30

## 2016-08-18 MED ORDER — PANTOPRAZOLE SODIUM 40 MG IV SOLR
40.0000 mg | Freq: Once | INTRAVENOUS | Status: AC
  Administered 2016-08-18: 40 mg via INTRAVENOUS

## 2016-08-18 MED ORDER — DEXTROSE 5 % IV SOLN
1.0000 g | Freq: Once | INTRAVENOUS | Status: AC
  Administered 2016-08-18: 1 g via INTRAVENOUS
  Filled 2016-08-18: qty 10

## 2016-08-18 MED ORDER — IOPAMIDOL (ISOVUE-300) INJECTION 61%
INTRAVENOUS | Status: AC
  Administered 2016-08-18: 100 mL
  Filled 2016-08-18: qty 100

## 2016-08-18 MED ORDER — IOPAMIDOL (ISOVUE-300) INJECTION 61%
INTRAVENOUS | Status: DC
Start: 2016-08-18 — End: 2016-08-18
  Filled 2016-08-18: qty 30

## 2016-08-18 NOTE — ED Notes (Signed)
Pt stated she had to leave. Dr. Leonides Schanz met patient in the hallway and states if patient was adamant about leaving she would be able to but stressed the importance of following up with a surgeon for follow up care. Pt observed ambulating out of ED with independent steady gait.

## 2016-08-18 NOTE — ED Provider Notes (Signed)
[facility]-EMERGENCY DEPT Provider Note   CSN: [id number] Arrival date & time: 08/17/16  2144     History   Chief Complaint Chief Complaint  Patient presents with  . Abdominal Pain  . Post-op Problem    HPI Erica [name] is a 36 y.o. female with a hx of anemia, HTN presents to the Emergency Department complaining of gradual, persistent, progressively worsening Epigastric abdominal pain onset earlier this evening with associated nausea but without vomiting. Patient reports she had a hysterectomy on Monday. Record review shows that she had bilateral salpingectomy for sterilization by Dr. Shawnie Pons. Patient reports some generalized abdominal soreness after the surgery but epigastric pain is new. She also reports dysuria without hematuria.  She denies any other abdominal surgeries.  Patient reports subjective fevers and chills at home (never measured) but no measured fever on arrival here in the emergency department.  She denies diarrhea, vomiting, back pain, flank pain.     The history is provided by the patient and medical records. No language interpreter was used.    Past Medical History:  Diagnosis Date  . Anemia   . Hx: UTI (urinary tract infection)   . Hypertension    borderline at [facility] referred to PCP but patient did not follow up, no meds  . SVD (spontaneous vaginal delivery)    x 3    Patient Active Problem List   Diagnosis Date Noted  . Encounter for sterilization 07/30/2016    Past Surgical History:  Procedure Laterality Date  . LAPAROSCOPIC BILATERAL SALPINGECTOMY Bilateral 08/12/2016   Procedure: LAPAROSCOPIC BILATERAL SALPINGECTOMY;  Surgeon: Reva Bores, MD;  Location: [facility] ORS;  Service: Gynecology;  Laterality: Bilateral;  . NO PAST SURGERIES      OB History    Gravida Para Term Preterm AB Living   4 3 3   1 3    SAB TAB Ectopic Multiple Live Births   1               Home Medications    Prior to Admission medications   Medication Sig Start Date End  Date Taking? Authorizing Provider  acetaminophen (TYLENOL) 500 MG tablet Take 1,000 mg by mouth every 6 (six) hours as needed (for pain).    [provider]  Biotin w/ Vitamins C & E (HAIR/SKIN/NAILS PO) Take 2 tablets by mouth daily at 3 pm.    [provider]  cephALEXin (KEFLEX) 500 MG capsule Take 1 capsule (500 mg total) by mouth 4 (four) times daily. 08/18/16   Lysette Lindenbaum, Dahlia Client, PA-C  ibuprofen (ADVIL,MOTRIN) 600 MG tablet Take 1 tablet (600 mg total) by mouth every 6 (six) hours as needed. 08/12/16   Reva Bores, MD  oxyCODONE-acetaminophen (PERCOCET/ROXICET) 5-325 MG tablet Take 1-2 tablets by mouth every 6 (six) hours as needed. 08/12/16   Reva Bores, MD    Family History History reviewed. No pertinent family history.  Social History Social History  Substance Use Topics  . Smoking status: Never Smoker  . Smokeless tobacco: Never Used  . Alcohol use No     Allergies   Patient has no known allergies.   Review of Systems Review of Systems  Constitutional: Positive for fever ( subjective). Negative for appetite change, diaphoresis, fatigue and unexpected weight change.  HENT: Negative for mouth sores.   Eyes: Negative for visual disturbance.  Respiratory: Negative for cough, chest tightness, shortness of breath and wheezing.   Cardiovascular: Negative for chest pain.  Gastrointestinal: Positive for abdominal distention, abdominal pain  and nausea. Negative for constipation, diarrhea and vomiting.  Endocrine: Negative for polydipsia, polyphagia and polyuria.  Genitourinary: Negative for dysuria, frequency, hematuria and urgency.  Musculoskeletal: Negative for back pain and neck stiffness.  Skin: Negative for rash.  Allergic/Immunologic: Negative for immunocompromised state.  Neurological: Negative for syncope, light-headedness and headaches.  Hematological: Does not bruise/bleed easily.  Psychiatric/Behavioral: Negative for sleep disturbance. The  patient is not nervous/anxious.   All other systems reviewed and are negative.    Physical Exam Updated Vital Signs BP 117/81   Pulse 75   Temp 98.2 F (36.8 C) (Oral)   Resp 18   Ht 5\' 1"  (1.549 m)   Wt 74.8 kg (165 lb)   LMP 08/02/2016 (Approximate)   SpO2 98%   BMI 31.18 kg/m   Physical Exam  Constitutional: She appears well-developed and well-nourished. No distress.  Awake, alert, nontoxic appearance  HENT:  Head: Normocephalic and atraumatic.  Mouth/Throat: Oropharynx is clear and moist. No oropharyngeal exudate.  Eyes: Conjunctivae are normal. No scleral icterus.  Neck: Normal range of motion. Neck supple.  Cardiovascular: Normal rate, regular rhythm and intact distal pulses.   Pulmonary/Chest: Effort normal and breath sounds normal. No respiratory distress. She has no wheezes.  Equal chest expansion  Abdominal: Soft. Bowel sounds are normal. She exhibits no distension and no mass. There is no hepatosplenomegaly. There is tenderness in the right upper quadrant and epigastric area. There is positive Murphy's sign. There is no rebound, no guarding and no tenderness at McBurney's point.  Microscopic surgical incisions are clean and dry without erythema. Abdomen is soft without rebound or guarding.  Significant right upper quadrant and epigastric abdominal pain.  Musculoskeletal: Normal range of motion. She exhibits no edema.  Neurological: She is alert.  Speech is clear and goal oriented Moves extremities without ataxia  Skin: Skin is warm and dry. She is not diaphoretic.  Psychiatric: She has a normal mood and affect.  Nursing note and vitals reviewed.    ED Treatments / Results  Labs (all labs ordered are listed, but only abnormal results are displayed) Labs Reviewed  COMPREHENSIVE METABOLIC PANEL - Abnormal; Notable for the following:       Result Value   Sodium 133 (*)    Glucose, Bld 151 (*)    AST 96 (*)    ALT 55 (*)    All other components within normal  limits  CBC - Abnormal; Notable for the following:    WBC 11.0 (*)    Hemoglobin 11.4 (*)    HCT 34.8 (*)    MCH 25.6 (*)    All other components within normal limits  URINALYSIS, ROUTINE W REFLEX MICROSCOPIC - Abnormal; Notable for the following:    Color, Urine AMBER (*)    APPearance TURBID (*)    Hgb urine dipstick LARGE (*)    Protein, ur 30 (*)    Leukocytes, UA LARGE (*)    Bacteria, UA FEW (*)    Squamous Epithelial / LPF 6-30 (*)    All other components within normal limits  URINE CULTURE  LIPASE, BLOOD  I-STAT CG4 LACTIC ACID, ED  POC URINE PREG, ED  I-STAT CG4 LACTIC ACID, ED     Radiology Ct Abdomen Pelvis W Contrast  Result Date: 08/18/2016 CLINICAL DATA:  36 year old female with history of bilateral salpingectomy 6 days ago presenting with upper abdominal pain. EXAM: CT ABDOMEN AND PELVIS WITH CONTRAST TECHNIQUE: Multidetector CT imaging of the abdomen and pelvis was performed using  the standard protocol following bolus administration of intravenous contrast. CONTRAST:  ISOVUE-300 IOPAMIDOL (ISOVUE-300) INJECTION 61% COMPARISON:  None. FINDINGS: Lower chest: There minimal bibasilar atelectatic changes. The visualized lung bases are otherwise clear. No intra-abdominal free air. Trace free fluid within the cul-de-sac. Hepatobiliary: The liver is unremarkable. No intrahepatic biliary ductal dilatation. Rim calcified stones noted within the gallbladder and in the gallbladder neck. There is no pericholecystic fluid. Ultrasound may provide better evaluation of the gallbladder. The gallbladder is mildly distended. Pancreas: Unremarkable. No pancreatic ductal dilatation or surrounding inflammatory changes. Spleen: Normal in size without focal abnormality. Adrenals/Urinary Tract: The adrenal glands are unremarkable. There is a large staghorn calculus extending from the inferior and mid pole of the right kidney into the right renal pelvis tiny measuring approximately 6.5 cm in  greatest length. There is associated high-grade occlusion of the right renal pelvis with chronic appearing dilatation of the superior pole calices and parenchymal atrophy. There is mild right hydroureter of indeterminate chronicity. No stone noted in the right ureter no periureteric stranding. The left kidney, left ureter and urinary bladder appear unremarkable. Stomach/Bowel: There is moderate stool throughout the colon. No evidence of bowel obstruction or active inflammation. Normal appendix. Vascular/Lymphatic: No significant vascular findings are present. No enlarged abdominal or pelvic lymph nodes. Reproductive: The uterus is retroflexed and appears unremarkable. The ovaries appear unremarkable as well. No pelvic mass or loculated fluid collection. Other: None Musculoskeletal: No acute or significant osseous findings. IMPRESSION: 1. Cholelithiasis with stone in the gallbladder neck. No definite pericholecystic fluid or evidence of active gallbladder inflammation by CT. Ultrasound may provide better evaluation of the gallbladder if clinically indicated. 2. 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 There is mild right hydroureter of indeterminate chronicity. Correlation with clinical exam and urinalysis recommended. 3. Moderate colonic stool burden. No bowel obstruction or active inflammation. Normal appendix. Electronically Signed   By: Elgie Collard M.D.   On: 08/18/2016 03:05   US Abdomen Limited  Result Date: 08/18/2016 CLINICAL DATA:  RIGHT upper quadrant pain, follow-up gallstones. EXAM: ULTRASOUND ABDOMEN LIMITED RIGHT UPPER QUADRANT COMPARISON:  CT abdomen and pelvis August 18, 2016 at 0238 hours FINDINGS: Gallbladder: Echogenic gallstone measuring to 4.2 cm with acoustic shadowing. Top normal gallbladder wall at 3 mm. No pericholecystic fluid. No sonographic Murphy's sign was elicited. Common  bile duct: Diameter: 4 mm. Liver: No focal lesion identified. Within normal limits in parenchymal echogenicity. Included RIGHT upper quadrant demonstrates RIGHT hydronephrosis and staghorn calculus. IMPRESSION: Cholelithiasis without sonographic findings of acute cholecystitis. Electronically Signed   By: Awilda Metro M.D.   On: 08/18/2016 05:17    Procedures Procedures (including critical care time)  Medications Ordered in ED Medications  sodium chloride 0.9 % bolus 1,000 mL (0 mLs Intravenous Stopped 08/18/16 0132)    Followed by  0.9 %  sodium chloride infusion (1,000 mLs Intravenous New Bag/Given 08/18/16 0126)  iopamidol (ISOVUE-300) 61 % injection (  Hold 08/18/16 0252)  cefTRIAXone (ROCEPHIN) 1 g in dextrose 5 % 50 mL IVPB (1 g Intravenous New Bag/Given 08/18/16 0609)  pantoprazole (PROTONIX) injection 40 mg (40 mg Intravenous Given 08/18/16 0126)  iopamidol (ISOVUE-300) 61 % injection (100 mLs  Contrast Given 08/18/16 0233)     Initial Impression / Assessment and Plan / ED Course  I have reviewed the triage vital signs and the nursing notes.  Pertinent labs & imaging results that were available  during my care of the patient were reviewed by me and considered in my medical decision making (see chart for details).     Patient presents with epigastric abdominal pain 6 days after elective bilateral laparoscopic salpingectomy. Patient initially with significant right upper quadrant abdominal and epigastric pain. CT scan showed cholelithiasis with stone in the gallbladder neck. Patient with mild leukocytosis of 11.0 and slightly elevated liver enzymes at 96 and 55 respectively.    On repeat abdominal exam patient's pain has improved significantly. She is minimally tender and has no rebound or guarding. No positive Murphy sign. Ultrasound shows nondistended gallbladder without wall thickening and negative Murphy's sign. Suspect patient has passed the stone that was initially seen in the  neck of her gallbladder on CT. She's tolerating by mouth. She appears well. She has remained afebrile without tachycardia or hypotension here in the emergency department. As she has improved and her US is reassuring, discussed outpatient follow-up with patient and reasons to return immediately to the emergency room including return of pain, development of measured fever, vomiting or other concerns. Discussed the potential for cholecystitis, development of life-threatening infection due to this or development of choledocholithiasis with patient. She plans to leave the country on Monday. I have recommended close follow-up with surgery and GI if she does not. She's been provided copies of her lab results and tests to take with her.  She states understanding and is in agreement with discharge home.  I again emphasized the importance of returning to the nearest emergency department for return of symptoms, worsening symptoms or any other concerns.  I suggested the patient avoid medications with Tylenol with her slightly elevated liver functions.  Urinalysis shows evidence of UTI. Culture sent and patient will be treated with Rocephin in the emergency room and Keflex. Suspect this is secondary to catheterization during her surgery.   The patient was discussed with and imaging reviewed by Dr. Elesa MassedWard who agrees with the treatment plan.  Final Clinical Impressions(s) / ED Diagnoses   Final diagnoses:  Right upper quadrant abdominal pain  Calculus of gallbladder without cholecystitis without obstruction  Elevated liver enzymes  Acute cystitis without hematuria    New Prescriptions New Prescriptions   CEPHALEXIN (KEFLEX) 500 MG CAPSULE    Take 1 capsule (500 mg total) by mouth 4 (four) times daily.    6:39 AM Pt now with returning RUQ abd pain and severe nausea.  Discussed with Dr. Corliss Skainssuei who will evaluate.     Cully Luckow, Boyd KerbsHannah, PA-C 08/18/16 [id number]    Ward, Layla MawKristen N, DO 08/18/16 0747    Ward,  Layla MawKristen N, DO 08/18/16 [id number]

## 2016-08-18 NOTE — ED Notes (Signed)
Pt sts pain a 1/10 currently. EDP made aware and stated to still wait for pt to see surgery.

## 2016-08-18 NOTE — Discharge Instructions (Signed)
1. Medications: usual home medications 2. Treatment: rest, drink plenty of fluids, advance diet slowly 3. Follow Up: Please followup with GI in 2 days and general surgery in 1 week for discussion of your diagnoses and further evaluation after today's visit; if you do not have a primary care doctor use the resource guide provided to find one; Please return to the ER for persistent vomiting, high fevers, worsening pain or any concerning symptoms

## 2016-08-19 LAB — URINE CULTURE: Culture: >=100000 — AB

## 2016-08-20 ENCOUNTER — Telehealth: Payer: Self-pay | Admitting: Emergency Medicine

## 2016-08-20 NOTE — Telephone Encounter (Signed)
Post ED Visit - Positive Culture Follow-up  Culture report reviewed by antimicrobial stewardship pharmacist:  []  Enzo BiNathan Batchelder, Pharm.D. []  Celedonio MiyamotoJeremy Frens, Pharm.D., BCPS AQ-ID []  Garvin FilaMike Maccia, Pharm.D., BCPS [x]  Georgina PillionElizabeth Martin, Pharm.D., BCPS []  North BeachMinh Pham, 17[address] Rainbow BoulevardPharm.D., BCPS, AAHIVP []  Estella HuskMichelle Turner, Pharm.D., BCPS, AAHIVP []  Lysle Pearlachel Rumbarger, PharmD, BCPS []  Casilda Carlsaylor Stone, PharmD, BCPS []  Pollyann SamplesAndy Johnston, PharmD, BCPS  Positive urine culture Treated with cephalexin, organism sensitive to the same and no further patient follow-up is required at this time.  Berle MullMiller, Clemence Stillings 08/20/2016, 9:58 AM

## 2016-08-30 ENCOUNTER — Encounter: Payer: Self-pay | Admitting: General Practice

## 2016-08-30 ENCOUNTER — Encounter: Payer: Self-pay | Admitting: Obstetrics & Gynecology

## 2016-08-30 ENCOUNTER — Ambulatory Visit (INDEPENDENT_AMBULATORY_CARE_PROVIDER_SITE_OTHER): Payer: Managed Care, Other (non HMO) | Admitting: Family Medicine

## 2016-08-30 ENCOUNTER — Encounter: Payer: Self-pay | Admitting: Family Medicine

## 2016-08-30 VITALS — BP 142/95 | HR 104 | Ht 62.0 in | Wt 162.7 lb

## 2016-08-30 DIAGNOSIS — Z9889 Other specified postprocedural states: Secondary | ICD-10-CM | POA: Diagnosis not present

## 2016-08-30 DIAGNOSIS — Z09 Encounter for follow-up examination after completed treatment for conditions other than malignant neoplasm: Secondary | ICD-10-CM

## 2016-08-30 NOTE — Progress Notes (Signed)
   Subjective:    Patient ID: Erica FiddlerYovani [name] is a 36 y.o. female presenting with Routine Post Op  on 08/30/2016  HPI: Here today for post op check. She is s/p bilateral salpingectomy for sterilization and ovarian cancer prevention. Doing well. Notes increased pain at umbilicus. Seen in ED for gall stones--has yet to f/u with surgeon.  Review of Systems  Constitutional: Negative for chills and fever.  Respiratory: Negative for shortness of breath.   Cardiovascular: Negative for chest pain.  Gastrointestinal: Negative for abdominal pain, nausea and vomiting.  Genitourinary: Negative for dysuria.  Skin: Negative for rash.      Objective:    BP (!) 142/95   Pulse (!) 104   Ht 5\' 2"  (1.575 m)   Wt 162 lb 11.2 oz (73.8 kg)   LMP 08/02/2016 (Approximate)   BMI 29.76 kg/m  Physical Exam  Constitutional: She is oriented to person, place, and time. She appears well-developed and well-nourished. No distress.  HENT:  Head: Normocephalic and atraumatic.  Eyes: No scleral icterus.  Neck: Neck supple.  Cardiovascular: Normal rate.   Pulmonary/Chest: Effort normal.  Abdominal: Soft.  Neurological: She is alert and oriented to person, place, and time.  Skin: Skin is warm and dry.  Incisions are well healed  Psychiatric: She has a normal mood and affect.        Assessment & Plan:  Postop check - doing well. may return to full duty   Total face-to-face time with patient: 10 minutes. Over 50% of encounter was spent on counseling and coordination of care. Return if symptoms worsen or fail to improve.  Reva Boresanya S Baneza Bartoszek 08/30/2016 9:22 AM

## 2016-08-30 NOTE — Patient Instructions (Signed)

## 2016-09-02 ENCOUNTER — Other Ambulatory Visit (INDEPENDENT_AMBULATORY_CARE_PROVIDER_SITE_OTHER): Payer: Managed Care, Other (non HMO)

## 2016-09-02 ENCOUNTER — Ambulatory Visit (INDEPENDENT_AMBULATORY_CARE_PROVIDER_SITE_OTHER): Payer: Managed Care, Other (non HMO) | Admitting: Gastroenterology

## 2016-09-02 ENCOUNTER — Encounter: Payer: Self-pay | Admitting: Gastroenterology

## 2016-09-02 VITALS — BP 124/68 | HR 76 | Ht 63.0 in | Wt 163.4 lb

## 2016-09-02 DIAGNOSIS — R1011 Right upper quadrant pain: Secondary | ICD-10-CM | POA: Diagnosis not present

## 2016-09-02 LAB — CBC WITH DIFFERENTIAL/PLATELET
BASOS ABS: 0 10*3/uL (ref 0.0–0.1)
BASOS PCT: 0.4 % (ref 0.0–3.0)
EOS ABS: 0 10*3/uL (ref 0.0–0.7)
Eosinophils Relative: 0.6 % (ref 0.0–5.0)
HEMATOCRIT: 33.9 % — AB (ref 36.0–46.0)
Hemoglobin: 11.3 g/dL — ABNORMAL LOW (ref 12.0–15.0)
LYMPHS PCT: 19.8 % (ref 12.0–46.0)
Lymphs Abs: 0.9 10*3/uL (ref 0.7–4.0)
MCHC: 33.3 g/dL (ref 30.0–36.0)
MCV: 78.1 fl (ref 78.0–100.0)
MONO ABS: 0.4 10*3/uL (ref 0.1–1.0)
Monocytes Relative: 7.7 % (ref 3.0–12.0)
NEUTROS ABS: 3.3 10*3/uL (ref 1.4–7.7)
Neutrophils Relative %: 71.5 % (ref 43.0–77.0)
PLATELETS: 244 10*3/uL (ref 150.0–400.0)
RBC: 4.35 Mil/uL (ref 3.87–5.11)
RDW: 13 % (ref 11.5–15.5)
WBC: 4.6 10*3/uL (ref 4.0–10.5)

## 2016-09-02 LAB — COMPREHENSIVE METABOLIC PANEL
ALT: 222 U/L — AB (ref 0–35)
AST: 316 U/L — AB (ref 0–37)
Albumin: 4 g/dL (ref 3.5–5.2)
Alkaline Phosphatase: 99 U/L (ref 39–117)
BILIRUBIN TOTAL: 1 mg/dL (ref 0.2–1.2)
BUN: 10 mg/dL (ref 6–23)
CHLORIDE: 104 meq/L (ref 96–112)
CO2: 25 meq/L (ref 19–32)
CREATININE: 0.66 mg/dL (ref 0.40–1.20)
Calcium: 9.2 mg/dL (ref 8.4–10.5)
GFR: 107.25 mL/min (ref >60.00)
Glucose, Bld: 101 mg/dL — ABNORMAL HIGH (ref 70–99)
Potassium: 3.8 mEq/L (ref 3.5–5.1)
SODIUM: 136 meq/L (ref 135–145)
Total Protein: 7.5 g/dL (ref 6.0–8.3)

## 2016-09-02 MED ORDER — CIPROFLOXACIN HCL 500 MG PO TABS: 500.0000 mg | ORAL_TABLET | Freq: Two times a day (BID) | ORAL | 0 refills | Status: AC

## 2016-09-02 MED ORDER — METRONIDAZOLE 250 MG PO TABS: 250.0000 mg | ORAL_TABLET | Freq: Three times a day (TID) | ORAL | 0 refills | Status: AC

## 2016-09-02 NOTE — Progress Notes (Signed)
HPI: This is a  very pleasant 36 year old woman  who was referred to me by Dr. Alvira Monday to evaluate  right upper quadrant pains .    Chief complaint is right upper quadrant pains  For about 2-3 weeks she's been having RUQ intermittent pains.  She went to ER had labs and [place] and CT scan.  Tylenol helps the pain.  She's had sweats yesterday, last night. Has been taking tylenol with the Chills.  When she went to ER she was nauseas.  She was put on antibiotics for UTI that was found.  She comppleted the abx.  She had bilateral salpingectomy about a month ago (june 18).  No weight changes.  She had pain like this many months ago.  Fleeting pain then.   Old Data Reviewed: Abd [place]; 07/2016: Cholelithiasis without sonographic findings of acute cholecystitis.  CT scan abd, pelvis with IV and oral contrast: 07/2016: 1. Cholelithiasis with stone in the gallbladder neck. No definitepericholecystic fluid or evidence of active gallbladder inflammation by CT. Ultrasound may provide better evaluation of the gallbladder if clinically indicated. 2. 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 There is mild right hydroureter of indeterminate chronicity. Correlation with clinical exam and urinalysis recommended. 3. Moderate colonic stool burden. No bowel obstruction or active inflammation. Normal appendix.  Labs 07/2016: CMET: AST 96, ALT 55, otherwise normal CBC:  WBC 11, Hb 11.4, MCV 78 Lipase normal. Blood pregancy test neg UA >100K Group B strep and positive UA        Review of systems: Pertinent positive and negative review of systems were noted in the above HPI section. All other review negative.   Past Medical History:  Diagnosis Date  . Anemia   . Hx: UTI (urinary tract infection)   . Hypertension    borderline at [facility] referred to PCP but patient did not follow up, no meds   . SVD (spontaneous vaginal delivery)    x 3    Past Surgical History:  Procedure Laterality Date  . LAPAROSCOPIC BILATERAL SALPINGECTOMY Bilateral 08/12/2016   Procedure: LAPAROSCOPIC BILATERAL SALPINGECTOMY;  Surgeon: Reva Bores, MD;  Location: [facility] OR[facility];  Service: Gynecology;  Laterality: Bilateral;  . NO PAST SURGERIES      Current Outpatient Prescriptions  Medication Sig Dispense Refill  . acetaminophen (TYLENOL) 500 MG tablet Take 1,000 mg by mouth every 6 (six) hours as needed (for pain).    . Biotin w/ Vitamins C & E (HAIR/SKIN/NAILS PO) Take 2 tablets by mouth daily at 3 pm.     No current facility-administered medications for this visit.     Allergies as of 09/02/2016  . (No Known Allergies)    Family History  Problem Relation Age of Onset  . High Cholesterol Mother     Social History   Social History  . Marital status: Married    Spouse name: N/A  . Number of children: 3  . Years of education: N/A   Occupational History  . Not on file.   Social History Main Topics  . Smoking status: Never Smoker  . Smokeless tobacco: Never Used  . Alcohol use No  . Drug use: No  . Sexual activity: Yes    Birth control/ protection: None   Other Topics Concern  . Not on file   Social History Narrative  . No narrative on file     Physical Exam: Ht  5\' 3"  (1.6 m)   Wt 163 lb 6 oz (74.1 kg)   BMI 28.94 kg/m  Constitutional: generally well-appearing Psychiatric: alert and oriented x3 Eyes: extraocular movements intact Mouth: oral pharynx moist, no lesions Neck: supple no lymphadenopathy Cardiovascular: heart regular rate and rhythm Lungs: clear to auscultation bilaterally Abdomen: soft, Mild to moderately tender right upper quadrant, no Murphy's sign, nondistended, no obvious ascites, no peritoneal signs, normal bowel sounds Extremities: no lower extremity edema bilaterally Skin: no lesions on visible extremities   Assessment and plan: 36 y.o. female with   Likely symptomatic cholelithiasis versus early cholecystitis  She has clear gallstone disease on imaging. She describes sweats and chills the past day or 2 that are improved with Tylenol. She was really hurting last night her right upper quadrant and almost went back to the emergency room. I'm going to try to arrange expedited outpatient visit with general surgery for today or tomorrow. I'm not put her on empiric antibiotics, Cipro and Flagyl, if [facility] then I will likely have her go to the emergency room. She will also get a set of labs including a CBC and complete metabolic profile.    Please see the "Patient Instructions" section for addition details about the plan.   Rob Buntinganiel Cassiopeia Florentino, MD [facility] 09/02/2016, 8:46 AM  Cc: ER Dr. Dalene SeltzerSchlossman

## 2016-09-02 NOTE — Patient Instructions (Addendum)
You have been scheduled for an appointment with Dr Georgan[facility] CurioJerkin at [facility]. Your appointment is on 09/03/16 at 10:30 am. Please arrive at 10 am for registration. Make certain to bring a list of current medications, including any over the counter medications or vitamins. Also bring your co-pay if you have one as well as your insurance cards. [facility] is located at [address], [address]. Should you need to reschedule your appointment, please contact them at [phone].  You will have labs checked today in the basement lab.  Please head down after you check out with the front desk  (cbc, cmet).  Cipro 500mg  po twice daily for 7 days.  Flagyl 250mg  po tid for 7 days.  We have sent you antibiotics to your pharmacy.  Normal BMI (Body Mass Index- based on height and weight) is between 19 and 25. Your BMI today is Body mass index is 28.94 kg/m. Marland Kitchen. Please consider follow up  regarding your BMI with your Primary Care Provider.

## 2016-09-03 ENCOUNTER — Ambulatory Visit: Payer: Self-pay | Admitting: Surgery

## 2016-09-03 ENCOUNTER — Other Ambulatory Visit: Payer: Managed Care, Other (non HMO)

## 2016-09-03 ENCOUNTER — Other Ambulatory Visit: Payer: Self-pay

## 2016-09-03 DIAGNOSIS — R1011 Right upper quadrant pain: Secondary | ICD-10-CM

## 2016-09-04 LAB — HEPATITIS C ANTIBODY: HCV AB: NEGATIVE

## 2016-09-04 LAB — ANA: ANA: POSITIVE — AB

## 2016-09-04 LAB — HEPATITIS B SURFACE ANTIBODY,QUALITATIVE: HEP B S AB: NEGATIVE

## 2016-09-04 LAB — ANTI-NUCLEAR AB-TITER (ANA TITER): ANA Titer 1: 1:40 {titer} — ABNORMAL HIGH

## 2016-09-04 LAB — HEPATITIS B SURFACE ANTIGEN: Hepatitis B Surface Ag: NEGATIVE

## 2016-09-04 LAB — HEPATITIS A ANTIBODY, TOTAL: HEP A TOTAL AB: REACTIVE — AB

## 2016-09-07 ENCOUNTER — Encounter (HOSPITAL_COMMUNITY): Payer: Self-pay | Admitting: *Deleted

## 2016-09-09 ENCOUNTER — Encounter (HOSPITAL_COMMUNITY): Payer: Self-pay | Admitting: Surgery

## 2016-09-09 DIAGNOSIS — K8010 Calculus of gallbladder with chronic cholecystitis without obstruction: Secondary | ICD-10-CM | POA: Diagnosis present

## 2016-09-09 NOTE — H&P (Signed)
General Surgery [facility]  Erica [name] 09/03/2016 10:20 AM Location: [facility] Patient #: [id number] DOB: 1980-03-09 Married / Language: English / Race: Refused to Report/Unreported Female   History of Present Illness Erica Heckler MD; 09/03/2016 11:22 AM) The patient is a 36 year old female who presents for evaluation of gall stones.  CC: symptomatic cholelithiasis, cholecystitis  Patient is referred by Dr. Wendall [name] for surgical management of symptomatic cholelithiasis and cholecystitis. Patient first developed abdominal pain approximately 3 months ago. Episodes were intermittent lasting 10-15 minutes. Patient underwent bilateral tubal ligation as a laparoscopic surgical procedure at [facility] 3 weeks ago. Following that surgery she developed right upper quadrant abdominal pain. She was seen in the emergency department on August 18, 2016. She underwent CT scan of the abdomen and ultrasound of the abdomen both demonstrating cholelithiasis. The largest gallstone measures 4.2 cm and appears to be impacted in the gallbladder neck. Patient was seen by Dr. Wendall [name] yesterday in consultation. He was concerned about cholecystitis and the patient was started on Cipro and Flagyl. She was referred to surgery to consider cholecystectomy. Patient has not had any other surgical procedures on her abdomen except for the recent bilateral tubal ligation. There is no family history of gallbladder disease. Patient has had 3 children. She is employed as a Location manager. She denies any history of jaundice or acholic stools. There is no history of hepatitis or pancreatitis. Patient would like to proceed with cholecystectomy as soon as possible.   Past Surgical History Erica Bickers, LPN; 0/45/4098 11:91 AM) No pertinent past surgical history   Diagnostic Studies History Erica Bickers, LPN; 4/78/2956 21:30 AM) Colonoscopy  never Mammogram   never  Allergies Erica Bickers, LPN; 8/65/7846 96:29 AM) No Known Allergies 09/03/2016 Allergies Reconciled   Medication History Erica Bickers, LPN; 07/23/4130 44:01 AM) Cipro (500MG  Tablet, Oral) Active. Flagyl (250MG  Tablet, Oral) Active. Tylenol (500MG  Capsule, Oral) Active. Biotin w/ Vitamins C & E (1250-7.5-7.5MCG-MG-UNT Tablet Chewable, Oral) Active. Medications Reconciled  Social History Erica Bickers, LPN; 0/27/2536 64:40 AM) Caffeine use  Coffee, Tea. No alcohol use  No drug use  Tobacco use  Never smoker.  Family History Erica Bickers, LPN; 3/47/4259 56:38 AM) Family history unknown  First Degree Relatives  Thyroid problems  Family Members In General.  Pregnancy / Birth History Erica Bickers, LPN; 7/56/4332 95:18 AM) Age at menarche  11 years. Length (months) of breastfeeding  3-6 Maternal age  70-20 Para  3 Regular periods   Other Problems Erica Bickers, LPN; 8/41/6606 30:16 AM) Cholelithiasis  High blood pressure     Review of Systems Erica Endo Dockery LPN; 0/11/9321 55:73 AM) General Present- Chills and Night Sweats. Not Present- Appetite Loss, Fatigue, Fever, Weight Gain and Weight Loss. Gastrointestinal Present- Abdominal Pain. Not Present- Bloating, Bloody Stool, Change in Bowel Habits, Chronic diarrhea, Constipation, Difficulty Swallowing, Excessive gas, Gets full quickly at meals, Hemorrhoids, Indigestion, Nausea, Rectal Pain and Vomiting.  Vitals Erica Endo Dockery LPN; 04/16/2540 70:62 AM) 09/03/2016 10:21 AM Weight: 165.4 lb Height: 62in Body Surface Area: 1.76 m Body Mass Index: 30.25 kg/m  Temp.: 98.21F(Oral)  Pulse: 95 (Regular)  BP: 118/68 (Sitting, Left Arm, Standard)       Physical Exam Erica Heckler MD; 09/03/2016 11:23 AM) The physical exam findings are as follows: Note:CONSTITUTIONAL See vital signs recorded above  GENERAL APPEARANCE Development: normal Nutritional status: normal Gross  deformities: none  SKIN Rash, lesions, ulcers: none Induration, erythema: none Nodules: none palpable  EYES Conjunctiva and lids: normal Pupils: equal and reactive Iris: normal bilaterally  EARS, NOSE, MOUTH, THROAT External ears: no lesion or deformity External nose: no lesion or deformity Hearing: grossly normal Lips: no lesion or deformity Dentition: normal for age Oral mucosa: moist  NECK Symmetric: yes Trachea: midline Thyroid: no palpable nodules in the thyroid bed  CHEST Respiratory effort: normal Retraction or accessory muscle use: no Breath sounds: normal bilaterally Rales, rhonchi, wheeze: none  CARDIOVASCULAR Auscultation: regular rhythm, normal rate Murmurs: none Pulses: carotid and radial pulse 2+ palpable Lower extremity edema: none Lower extremity varicosities: none  ABDOMEN Distension: none Masses: none palpable Tenderness: none Hepatosplenomegaly: not present Hernia: not present Recent surgical wounds involving the umbilicus and left lower quadrant with topical Dermabond in place. No sign of infection or herniation.  MUSCULOSKELETAL Station and gait: normal Digits and nails: no clubbing or cyanosis Muscle strength: grossly normal all extremities Range of motion: grossly normal all extremities Deformity: none  LYMPHATIC Cervical: none palpable Supraclavicular: none palpable  PSYCHIATRIC Oriented to person, place, and time: yes Mood and affect: normal for situation Judgment and insight: appropriate for situation    Assessment & Plan Erica [name](Erica [name] M. Erica Willis MD; 09/03/2016 11:25 AM) CHOLECYSTITIS WITH CHOLELITHIASIS (K80.10) Current Plans Pt Education - Pamphlet Given - Laparoscopic Gallbladder Surgery: discussed with patient and provided information. Patient presents on referral from gastroenterology with symptomatic cholelithiasis and cholecystitis. Patient is provided with written literature on gallbladder surgery to review at home.  I  have recommended proceeding with laparoscopic cholecystectomy with intraoperative cholangiography in the immediate future. The patient and I discussed the procedure in detail with diagrams and written literature. We discussed laparoscopic approach. We discussed potential for conversion to open surgery. We discussed the hospital stay to be anticipated and her time out of work following the procedure. She understands and wishes to proceed with surgery in the near future.  The risks and benefits of the procedure have been discussed at length with the patient. The patient understands the proposed procedure, potential alternative treatments, and the course of recovery to be expected. All of the patient's questions have been answered at this time. The patient wishes to proceed with surgery.  Erica Hecklerodd M. Sidonia Nutter, MD, [facility] Office: [phone]

## 2016-09-09 NOTE — Progress Notes (Signed)
Anesthesia Chart Review:  Pt is a same day work up.   Pt is a 36 year old female scheduled for laparoscopic cholecystectomy with intraoperative cholangiogram on 09/10/2016 with Darnell Levelodd Gerkin, MD.   - GI is Rob Buntinganiel Jacobs, MD  PMH includes:  Anemia. Never smoker. BMI 30. S/p B salpingectomy 08/12/16.   Medications reviewed. Pt finished course of cipro and flagyl 09/09/16.   Labs will be obtained DOS.   - Labs done by GI 09/02/16 reviewed.  AST 316, ALT 222. CMET will be rechecked DOS. Likely elevated due to cholecystitis.   If labs acceptable DOS. I anticipate pt can proceed as scheduled.   Rica Mastngela Janey Petron, FNP-BC [facility]An[facility]iology Phone: [phone] 09/09/2016 9:53 AM

## 2016-09-10 ENCOUNTER — Ambulatory Visit (HOSPITAL_COMMUNITY): Payer: Managed Care, Other (non HMO)

## 2016-09-10 ENCOUNTER — Ambulatory Visit (HOSPITAL_COMMUNITY): Payer: Managed Care, Other (non HMO) | Admitting: Emergency Medicine

## 2016-09-10 ENCOUNTER — Encounter (HOSPITAL_COMMUNITY)
Admission: RE | Disposition: A | Discharge status: Home / Self Care | Payer: Self-pay | Source: Ambulatory Visit | Attending: Surgery

## 2016-09-10 ENCOUNTER — Observation Stay (HOSPITAL_COMMUNITY)
Admission: RE | Admit: 2016-09-10 | Discharge: 2016-09-11 | Disposition: A | Discharge status: Home / Self Care | Payer: Managed Care, Other (non HMO) | Source: Ambulatory Visit | Attending: Surgery | Admitting: Surgery

## 2016-09-10 ENCOUNTER — Encounter (HOSPITAL_COMMUNITY): Payer: Self-pay | Admitting: Certified Registered Nurse Anesthetist

## 2016-09-10 DIAGNOSIS — K8010 Calculus of gallbladder with chronic cholecystitis without obstruction: Secondary | ICD-10-CM | POA: Diagnosis present

## 2016-09-10 DIAGNOSIS — Z79899 Other long term (current) drug therapy: Secondary | ICD-10-CM | POA: Insufficient documentation

## 2016-09-10 DIAGNOSIS — K8012 Calculus of gallbladder with acute and chronic cholecystitis without obstruction: Principal | ICD-10-CM | POA: Insufficient documentation

## 2016-09-10 DIAGNOSIS — I10 Essential (primary) hypertension: Secondary | ICD-10-CM | POA: Diagnosis not present

## 2016-09-10 DIAGNOSIS — Z9851 Tubal ligation status: Secondary | ICD-10-CM | POA: Insufficient documentation

## 2016-09-10 DIAGNOSIS — Z419 Encounter for procedure for purposes other than remedying health state, unspecified: Secondary | ICD-10-CM

## 2016-09-10 HISTORY — PX: CHOLECYSTECTOMY: SHX55

## 2016-09-10 HISTORY — DX: Personal history of urinary calculi: Z87.442

## 2016-09-10 LAB — COMPREHENSIVE METABOLIC PANEL
ALBUMIN: 4.1 g/dL (ref 3.5–5.0)
ALK PHOS: 71 U/L (ref 38–126)
ALT: 50 U/L (ref 14–54)
ANION GAP: 6 (ref 5–15)
AST: 22 U/L (ref 15–41)
BUN: 12 mg/dL (ref 6–20)
CALCIUM: 9 mg/dL (ref 8.9–10.3)
CO2: 21 mmol/L — AB (ref 22–32)
Chloride: 108 mmol/L (ref 101–111)
Creatinine, Ser: 0.67 mg/dL (ref 0.44–1.00)
GFR calc Af Amer: >60 mL/min (ref >60)
GFR calc non Af Amer: >60 mL/min (ref >60)
GLUCOSE: 94 mg/dL (ref 65–99)
Potassium: 3.7 mmol/L (ref 3.5–5.1)
SODIUM: 135 mmol/L (ref 135–145)
Total Bilirubin: 0.4 mg/dL (ref 0.3–1.2)
Total Protein: 7.6 g/dL (ref 6.5–8.1)

## 2016-09-10 LAB — PROTIME-INR
INR: 1.08
Prothrombin Time: 14 seconds (ref 11.4–15.2)

## 2016-09-10 SURGERY — LAPAROSCOPIC CHOLECYSTECTOMY WITH INTRAOPERATIVE CHOLANGIOGRAM
Anesthesia: General

## 2016-09-10 MED ORDER — ONDANSETRON HCL 4 MG/2ML IJ SOLN: 4.0000 mg | Freq: Four times a day (QID) | INTRAMUSCULAR | Status: DC | PRN

## 2016-09-10 MED ORDER — SODIUM CHLORIDE 0.9 % IV SOLN
INTRAVENOUS | Status: DC | PRN
  Administered 2016-09-10: 14:00:00

## 2016-09-10 MED ORDER — SODIUM CHLORIDE 0.9 % IR SOLN
Status: DC | PRN
  Administered 2016-09-10: 1000 mL

## 2016-09-10 MED ORDER — SUGAMMADEX SODIUM 200 MG/2ML IV SOLN
INTRAVENOUS | Status: AC
  Filled 2016-09-10: qty 2

## 2016-09-10 MED ORDER — FENTANYL CITRATE (PF) 250 MCG/5ML IJ SOLN
INTRAMUSCULAR | Status: DC | PRN
Start: 2016-09-10 — End: 2016-09-10
  Administered 2016-09-10: 100 ug via INTRAVENOUS
  Administered 2016-09-10 (×2): 50 ug via INTRAVENOUS

## 2016-09-10 MED ORDER — LACTATED RINGERS IV SOLN
INTRAVENOUS | Status: DC
  Administered 2016-09-10: 13:00:00 via INTRAVENOUS

## 2016-09-10 MED ORDER — PROPOFOL 10 MG/ML IV BOLUS
INTRAVENOUS | Status: DC | PRN
  Administered 2016-09-10: 150 mg via INTRAVENOUS

## 2016-09-10 MED ORDER — ONDANSETRON HCL 4 MG/2ML IJ SOLN
INTRAMUSCULAR | Status: DC | PRN
Start: 2016-09-10 — End: 2016-09-10
  Administered 2016-09-10: 4 mg via INTRAVENOUS

## 2016-09-10 MED ORDER — FENTANYL CITRATE (PF) 100 MCG/2ML IJ SOLN: 25.0000 ug | INTRAMUSCULAR | Status: DC | PRN

## 2016-09-10 MED ORDER — EPHEDRINE SULFATE-NACL 50-0.9 MG/10ML-% IV SOSY
PREFILLED_SYRINGE | INTRAVENOUS | Status: DC | PRN
  Administered 2016-09-10 (×2): 10 mg via INTRAVENOUS

## 2016-09-10 MED ORDER — DEXAMETHASONE SODIUM PHOSPHATE 10 MG/ML IJ SOLN
INTRAMUSCULAR | Status: DC | PRN
  Administered 2016-09-10: 8 mg via INTRAVENOUS

## 2016-09-10 MED ORDER — PHENYLEPHRINE 40 MCG/ML (10ML) SYRINGE FOR IV PUSH (FOR BLOOD PRESSURE SUPPORT)
PREFILLED_SYRINGE | INTRAVENOUS | Status: AC
  Filled 2016-09-10: qty 10

## 2016-09-10 MED ORDER — LIDOCAINE HCL (CARDIAC) 20 MG/ML IV SOLN
INTRAVENOUS | Status: AC
  Filled 2016-09-10: qty 5

## 2016-09-10 MED ORDER — SUGAMMADEX SODIUM 200 MG/2ML IV SOLN
INTRAVENOUS | Status: DC | PRN
  Administered 2016-09-10: 150 mg via INTRAVENOUS

## 2016-09-10 MED ORDER — FENTANYL CITRATE (PF) 250 MCG/5ML IJ SOLN
INTRAMUSCULAR | Status: AC
  Filled 2016-09-10: qty 5

## 2016-09-10 MED ORDER — KCL IN DEXTROSE-NACL 20-5-0.45 MEQ/L-%-% IV SOLN
INTRAVENOUS | Status: DC
  Administered 2016-09-10: 16:00:00 via INTRAVENOUS
  Filled 2016-09-10 (×2): qty 1000

## 2016-09-10 MED ORDER — ACETAMINOPHEN 325 MG PO TABS: 650.0000 mg | ORAL_TABLET | Freq: Four times a day (QID) | ORAL | Status: DC | PRN

## 2016-09-10 MED ORDER — CHLORHEXIDINE GLUCONATE CLOTH 2 % EX PADS: 6.0000 | MEDICATED_PAD | Freq: Once | CUTANEOUS | Status: DC

## 2016-09-10 MED ORDER — ROCURONIUM BROMIDE 50 MG/5ML IV SOLN
INTRAVENOUS | Status: AC
  Filled 2016-09-10: qty 1

## 2016-09-10 MED ORDER — IOPAMIDOL (ISOVUE-300) INJECTION 61%
INTRAVENOUS | Status: AC
  Filled 2016-09-10: qty 50

## 2016-09-10 MED ORDER — 0.9 % SODIUM CHLORIDE (POUR BTL) OPTIME
TOPICAL | Status: DC | PRN
Start: 2016-09-10 — End: 2016-09-10
  Administered 2016-09-10: 1000 mL

## 2016-09-10 MED ORDER — KETOROLAC TROMETHAMINE 30 MG/ML IJ SOLN
INTRAMUSCULAR | Status: DC | PRN
  Administered 2016-09-10: 30 mg via INTRAVENOUS

## 2016-09-10 MED ORDER — BUPIVACAINE-EPINEPHRINE (PF) 0.25% -1:200000 IJ SOLN
INTRAMUSCULAR | Status: AC
  Filled 2016-09-10: qty 30

## 2016-09-10 MED ORDER — EPHEDRINE 5 MG/ML INJ
INTRAVENOUS | Status: AC
Start: 2016-09-10 — End: ?
  Filled 2016-09-10: qty 10

## 2016-09-10 MED ORDER — HYDROMORPHONE HCL 1 MG/ML IJ SOLN
1.0000 mg | INTRAMUSCULAR | Status: DC | PRN
  Administered 2016-09-10: 1 mg via INTRAVENOUS
  Filled 2016-09-10: qty 1

## 2016-09-10 MED ORDER — MIDAZOLAM HCL 2 MG/2ML IJ SOLN
INTRAMUSCULAR | Status: AC
  Filled 2016-09-10: qty 2

## 2016-09-10 MED ORDER — ONDANSETRON 4 MG PO TBDP: 4.0000 mg | ORAL_TABLET | Freq: Four times a day (QID) | ORAL | Status: DC | PRN

## 2016-09-10 MED ORDER — LIDOCAINE 2% (20 MG/ML) 5 ML SYRINGE
INTRAMUSCULAR | Status: DC | PRN
  Administered 2016-09-10: 100 mg via INTRAVENOUS

## 2016-09-10 MED ORDER — CEFAZOLIN SODIUM-DEXTROSE 2-4 GM/100ML-% IV SOLN
2.0000 g | INTRAVENOUS | Status: AC
  Administered 2016-09-10: 2 g via INTRAVENOUS

## 2016-09-10 MED ORDER — ROCURONIUM BROMIDE 10 MG/ML (PF) SYRINGE
PREFILLED_SYRINGE | INTRAVENOUS | Status: DC | PRN
  Administered 2016-09-10: 40 mg via INTRAVENOUS
  Administered 2016-09-10: 10 mg via INTRAVENOUS

## 2016-09-10 MED ORDER — MIDAZOLAM HCL 2 MG/2ML IJ SOLN
INTRAMUSCULAR | Status: DC | PRN
  Administered 2016-09-10 (×2): 1 mg via INTRAVENOUS

## 2016-09-10 MED ORDER — ACETAMINOPHEN 650 MG RE SUPP: 650.0000 mg | Freq: Four times a day (QID) | RECTAL | Status: DC | PRN

## 2016-09-10 MED ORDER — BUPIVACAINE-EPINEPHRINE 0.25% -1:200000 IJ SOLN
INTRAMUSCULAR | Status: DC | PRN
  Administered 2016-09-10: 20 mL

## 2016-09-10 MED ORDER — KETOROLAC TROMETHAMINE 30 MG/ML IJ SOLN
INTRAMUSCULAR | Status: AC
  Filled 2016-09-10: qty 1

## 2016-09-10 MED ORDER — HYDROCODONE-ACETAMINOPHEN 5-325 MG PO TABS
1.0000 | ORAL_TABLET | ORAL | Status: DC | PRN
  Administered 2016-09-10 – 2016-09-11 (×2): 2 via ORAL
  Filled 2016-09-10 (×3): qty 2

## 2016-09-10 SURGICAL SUPPLY — 45 items
APPLIER CLIP ROT 10 11.4 M/L (STAPLE) ×3
APR CLP MED LRG 11.4X10 (STAPLE) ×1
BAG SPEC RTRVL LRG 6X4 10 (ENDOMECHANICALS) ×1
BLADE CLIPPER SURG (BLADE) IMPLANT
CANISTER SUCT 3000ML PPV (MISCELLANEOUS) ×3 IMPLANT
CHLORAPREP W/TINT 26ML (MISCELLANEOUS) ×3 IMPLANT
CLIP APPLIE ROT 10 11.4 M/L (STAPLE) ×1 IMPLANT
CLOSURE WOUND 1/2 X4 (GAUZE/BANDAGES/DRESSINGS) ×1
COVER MAYO STAND STRL (DRAPES) ×3 IMPLANT
COVER SURGICAL LIGHT HANDLE (MISCELLANEOUS) ×3 IMPLANT
DRAPE C-ARM 42X72 X-RAY (DRAPES) ×3 IMPLANT
ELECT REM PT RETURN 9FT ADLT (ELECTROSURGICAL) ×3
ELECTRODE REM PT RTRN 9FT ADLT (ELECTROSURGICAL) ×1 IMPLANT
GAUZE SPONGE 2X2 8PLY STRL LF (GAUZE/BANDAGES/DRESSINGS) ×1 IMPLANT
GLOVE BIO SURGEON STRL SZ7.5 (GLOVE) ×2 IMPLANT
GLOVE BIOGEL PI IND STRL 6.5 (GLOVE) IMPLANT
GLOVE BIOGEL PI IND STRL 7.5 (GLOVE) IMPLANT
GLOVE BIOGEL PI INDICATOR 6.5 (GLOVE) ×2
GLOVE BIOGEL PI INDICATOR 7.5 (GLOVE) ×2
GLOVE SURG ORTHO 8.0 STRL STRW (GLOVE) ×3 IMPLANT
GOWN STRL REUS W/ TWL LRG LVL3 (GOWN DISPOSABLE) ×2 IMPLANT
GOWN STRL REUS W/ TWL XL LVL3 (GOWN DISPOSABLE) ×1 IMPLANT
GOWN STRL REUS W/TWL LRG LVL3 (GOWN DISPOSABLE) ×9
GOWN STRL REUS W/TWL XL LVL3 (GOWN DISPOSABLE) ×3
KIT BASIN OR (CUSTOM PROCEDURE TRAY) ×3 IMPLANT
KIT ROOM TURNOVER OR (KITS) ×3 IMPLANT
NS IRRIG 1000ML POUR BTL (IV SOLUTION) ×3 IMPLANT
PAD ARMBOARD 7.5X6 YLW CONV (MISCELLANEOUS) ×3 IMPLANT
POUCH SPECIMEN RETRIEVAL 10MM (ENDOMECHANICALS) ×2 IMPLANT
SCISSORS LAP 5X35 DISP (ENDOMECHANICALS) ×3 IMPLANT
SET CHOLANGIOGRAPH 5 50 .035 (SET/KITS/TRAYS/PACK) ×3 IMPLANT
SET IRRIG TUBING LAPAROSCOPIC (IRRIGATION / IRRIGATOR) ×3 IMPLANT
SLEEVE ENDOPATH XCEL 5M (ENDOMECHANICALS) ×3 IMPLANT
SPECIMEN JAR SMALL (MISCELLANEOUS) ×3 IMPLANT
SPONGE GAUZE 2X2 STER 10/PKG (GAUZE/BANDAGES/DRESSINGS) ×2
STRIP CLOSURE SKIN 1/2X4 (GAUZE/BANDAGES/DRESSINGS) ×2 IMPLANT
SUT MNCRL AB 4-0 PS2 18 (SUTURE) ×3 IMPLANT
TAPE CLOTH SURG 4X10 WHT LF (GAUZE/BANDAGES/DRESSINGS) ×2 IMPLANT
TOWEL OR 17X24 6PK STRL BLUE (TOWEL DISPOSABLE) ×3 IMPLANT
TOWEL OR 17X26 10 PK STRL BLUE (TOWEL DISPOSABLE) ×3 IMPLANT
TRAY LAPAROSCOPIC MC (CUSTOM PROCEDURE TRAY) ×3 IMPLANT
TROCAR XCEL BLUNT TIP 100MML (ENDOMECHANICALS) ×3 IMPLANT
TROCAR XCEL NON-BLD 11X100MML (ENDOMECHANICALS) ×3 IMPLANT
TROCAR XCEL NON-BLD 5MMX100MML (ENDOMECHANICALS) ×3 IMPLANT
TUBING INSUFFLATION (TUBING) ×3 IMPLANT

## 2016-09-10 NOTE — Transfer of Care (Signed)
Immediate Anesthesia Transfer of Care Note  Patient: Erica [name]  Procedure(s) Performed: Procedure(s): LAPAROSCOPIC CHOLECYSTECTOMY WITH INTRAOPERATIVE CHOLANGIOGRAM (N/A)  Patient Location: PACU  Anesthesia Type:General  Level of Consciousness: awake, alert , oriented and patient cooperative  Airway & Oxygen Therapy: Patient Spontanous Breathing  Post-op Assessment: Report given to RN, Post -op Vital signs reviewed and stable and Patient moving all extremities X 4  Post vital signs: Reviewed and stable  Last Vitals:  Vitals:   09/10/16 1135  BP: 138/85  Pulse: 83  Resp: 18  Temp: 36.7 C    Last Pain:  Vitals:   09/10/16 1135  TempSrc: Oral         Complications: No apparent anesthesia complications

## 2016-09-10 NOTE — Interval H&P Note (Signed)
History and Physical Interval Note:  09/10/2016 12:58 PM  Erica [name]  has presented today for surgery, with the diagnosis of cholecystitis with cholelithiasis  The various methods of treatment have been discussed with the patient and family. After consideration of risks, benefits and other options for treatment, the patient has consented to    Procedure(s): LAPAROSCOPIC CHOLECYSTECTOMY WITH INTRAOPERATIVE CHOLANGIOGRAM (N/A) as a surgical intervention .    The patient's history has been reviewed, patient examined, no change in status, stable for surgery.  I have reviewed the patient's chart and labs.  Questions were answered to the patient's satisfaction.    Velora Hecklerodd M. Ruairi Stutsman, MD, [facility] Office: [phone]   Dalaina Tates MJudie Petit

## 2016-09-10 NOTE — Anesthesia Procedure Notes (Signed)
Procedure Name: Intubation Date/Time: 09/10/2016 12:32 PM Performed by: Julieta Bellini Pre-anesthesia Checklist: Patient identified, Emergency Drugs available, Suction available and Patient being monitored Patient Re-evaluated:Patient Re-evaluated prior to induction Oxygen Delivery Method: Circle system utilized Preoxygenation: Pre-oxygenation with 100% oxygen Induction Type: IV induction Ventilation: Mask ventilation without difficulty Laryngoscope Size: 3 and Mac Grade View: Grade I Tube type: Oral Tube size: 7.0 mm Number of attempts: 1 Airway Equipment and Method: Stylet Placement Confirmation: ETT inserted through vocal cords under direct vision,  positive ETCO2 and breath sounds checked- equal and bilateral Secured at: 22 cm Tube secured with: Tape Dental Injury: Teeth and Oropharynx as per pre-operative assessment

## 2016-09-10 NOTE — Op Note (Signed)
Procedure Note  Pre-operative Diagnosis:  Cholecystitis, cholelithiasis  Post-operative Diagnosis:  same  Surgeon:  [name], MD, FACS  Assistant:  Magnus Ivan, RNFA   Procedure:  Laparoscopic cholecystectomy with intra-operative cholangiography  Anesthesia:  General  Estimated Blood Loss:  minimal  Drains: none         Specimen: Gallbladder to pathology  Indications:  The patient is a 36 year old female who presents for evaluation of gall stones.  CC: symptomatic cholelithiasis, cholecystitis  Patient is referred by Dr. Wendall Papa for surgical management of symptomatic cholelithiasis and cholecystitis. Patient first developed abdominal pain approximately 3 months ago. Episodes were intermittent lasting 10-15 minutes. Patient underwent bilateral tubal ligation as a laparoscopic surgical procedure at [facility] 3 weeks ago. Following that surgery she developed right upper quadrant abdominal pain. She was seen in the emergency department on August 18, 2016. She underwent CT scan of the abdomen and ultrasound of the abdomen both demonstrating cholelithiasis. The largest gallstone measures 4.2 cm and appears to be impacted in the gallbladder neck. Patient was seen by Dr. Wendall Papa yesterday in consultation. He was concerned about cholecystitis and the patient was started on Cipro and Flagyl. She was referred to surgery to consider cholecystectomy. Patient has not had any other surgical procedures on her abdomen except for the recent bilateral tubal ligation. There is no family history of gallbladder disease. Patient has had 3 children. She is employed as a Location manager. She denies any history of jaundice or acholic stools. There is no history of hepatitis or pancreatitis. Patient would like to proceed with cholecystectomy as soon as possible.  Procedure Details:  The patient was seen in the pre-op holding area. The risks, benefits, complications, treatment  options, and expected outcomes were previously discussed with the patient. The patient agreed with the proposed plan and has signed the informed consent form.  The patient was brought to the Operating Room, identified as [facility] and the procedure verified as laparoscopic cholecystectomy with intraoperative cholangiography. A "time out" was completed and the above information confirmed.  The patient was placed in the supine position. Following induction of general anesthesia, the abdomen was prepped and draped in the usual aseptic fashion.  An incision was made in the skin near the umbilicus. The midline fascia was incised and the peritoneal cavity was entered and a Hasson canula was introduced under direct vision.  The Hasson canula was secured with a 0-Vicryl pursestring suture. Pneumoperitoneum was established with carbon dioxide. Additional trocars were introduced under direct vision along the right costal margin in the midline, mid-clavicular line, and anterior axillary line.   The gallbladder was identified and the fundus grasped and retracted cephalad. Adhesions were taken down bluntly and the electrocautery was utilized as needed, taking care not to injure any adjacent structures. The infundibulum was grasped and retracted laterally, exposing the peritoneum overlying the triangle of Calot. The peritoneum was incised and structures exposed with blunt dissection. The cystic duct was clearly identified, bluntly dissected circumferentially, and clipped at the neck of the gallbladder.  An incision was made in the cystic duct and the cholangiogram catheter introduced. The catheter was secured using an ligaclip.  Real-time cholangiography was performed using C-arm fluoroscopy.  There was rapid filling of a normal caliber common bile duct.  There was reflux of contrast into the left and right hepatic ductal systems.  There was free flow distally into the duodenum without filling defect or  obstruction.  The catheter was removed  from the peritoneal cavity.  The cystic duct was then ligated with surgical clips and divided. The cystic artery was identified, dissected circumferentially, ligated with ligaclips, and divided.  The gallbladder was dissected away from the liver bed using the electrocautery for hemostasis. The gallbladder was completely removed from the liver and placed into an endocatch bag. The gallbladder was removed in the endocatch bag through the umbilical port site and submitted to pathology for review.  The right upper quadrant was irrigated and the gallbladder bed was inspected. Hemostasis was achieved with the electrocautery.  Pneumoperitoneum was released after viewing removal of the trocars with good hemostasis noted. The umbilical wound was irrigated and the fascia was then closed with the pursestring suture.  Local anesthetic was infiltrated at all port sites. The skin incisions were closed with 4-0 Monocril subcuticular sutures and steri-strips and dressings were applied.  Instrument, sponge, and needle counts were correct at the conclusion of the case.  The patient was awakened from anesthesia and brought to the recovery room in stable condition.  The patient tolerated the procedure well.   Velora Hecklerodd M. Lashanta Elbe, MD, [facility] Office: [phone]

## 2016-09-10 NOTE — Anesthesia Preprocedure Evaluation (Addendum)
Anesthesia Evaluation  Patient identified by MRN, date of birth, ID band Patient awake    Reviewed: Allergy & Precautions, NPO status , Patient's Chart, lab work & pertinent test results  Airway Mallampati: II  TM Distance: >3 FB Neck ROM: Full    Dental no notable dental hx. (+) Dental Advisory Given, Teeth Intact   Pulmonary neg pulmonary ROS,    Pulmonary exam normal breath sounds clear to auscultation       Cardiovascular hypertension, negative cardio ROS Normal cardiovascular exam Rhythm:Regular Rate:Normal     Neuro/Psych negative neurological ROS  negative psych ROS   GI/Hepatic negative GI ROS, Neg liver ROS,   Endo/Other  negative endocrine ROS  Renal/GU negative Renal ROS  negative genitourinary   Musculoskeletal negative musculoskeletal ROS (+)   Abdominal   Peds negative pediatric ROS (+)  Hematology negative hematology ROS (+)   Anesthesia Other Findings   Reproductive/Obstetrics negative OB ROS                            Anesthesia Physical  Anesthesia Plan  ASA: II  Anesthesia Plan: General   Post-op Pain Management:    Induction: Intravenous  PONV Risk Score and Plan: 3 and Ondansetron, Dexamethasone, Propofol and Midazolam  Airway Management Planned: Oral ETT  Additional Equipment:   Intra-op Plan:   Post-operative Plan: Extubation in OR  Informed Consent: I have reviewed the patients History and Physical, chart, labs and discussed the procedure including the risks, benefits and alternatives for the proposed anesthesia with the patient or authorized representative who has indicated his/her understanding and acceptance.   Dental advisory given  Plan Discussed with: CRNA and Anesthesiologist  Anesthesia Plan Comments:        Anesthesia Quick Evaluation

## 2016-09-11 ENCOUNTER — Encounter (HOSPITAL_COMMUNITY): Payer: Self-pay | Admitting: Surgery

## 2016-09-11 DIAGNOSIS — K8012 Calculus of gallbladder with acute and chronic cholecystitis without obstruction: Secondary | ICD-10-CM | POA: Diagnosis not present

## 2016-09-11 MED ORDER — HYDROCODONE-ACETAMINOPHEN 5-325 MG PO TABS: 1.0000 | ORAL_TABLET | ORAL | 0 refills | Status: DC | PRN

## 2016-09-11 NOTE — Discharge Summary (Signed)
Physician Discharge Summary [facility]  Patient ID: Erica [name] MRN: [id number] DOB/AGE: 1980-07-29 36 y.o.  Admit date: 09/10/2016 Discharge date: 09/11/2016  Admission Diagnoses:  Cholecystitis, cholelithiasis  Discharge Diagnoses:  Principal Problem:   Cholecystitis with cholelithiasis Active Problems:   Cholelithiasis with chronic cholecystitis   Discharged Condition: good  Hospital Course: Patient was admitted for observation following gallbladder surgery.  Post op course was uncomplicated.  Pain was well controlled.  Tolerated diet.  Patient was prepared for discharge home on POD#1.  Consults: None  Treatments: surgery: lap chole with IOC  Discharge Exam: Blood pressure 110/61, pulse 71, temperature (!) 97.5 F (36.4 C), temperature source Oral, resp. rate 18, height 5\' 2"  (1.575 [name]), weight 74.4 kg (164 lb), last menstrual period 09/10/2016, SpO2 100 %. HEENT - clear Neck - soft Chest - clear bilaterally Cor - RRR Abd - soft without distension; dressings dry and intact  Disposition: Home  Discharge Instructions    Diet - low sodium heart healthy    Complete by:  As directed    Discharge instructions    Complete by:  As directed    [facility]  LAPAROSCOPIC SURGERY:  POST-OP INSTRUCTIONS  Always review your discharge instruction sheet given to you by the facility where your surgery was performed.  A prescription for pain medication may be given to you upon discharge.  Take your pain medication as prescribed.  If narcotic pain medicine is not needed, then you may take acetaminophen (Tylenol) or ibuprofen (Advil) as needed.  Take your usually prescribed medications unless otherwise directed.  If you need a refill on your pain medication, please contact your pharmacy.  They will contact our office to request authorization. Prescriptions will not be filled after 5 P.[name]. or on weekends.  You should follow a light diet  the first few days after arrival home, such as soup and crackers or toast.  Be sure to include plenty of fluids daily.  Most patients will experience some swelling and bruising in the area of the incisions.  Ice packs will help.  Swelling and bruising can take several days to resolve.   It is common to experience some constipation after surgery.  Increasing fluid intake and taking a stool softener (such as Colace) will usually help or prevent this problem from occurring.  A mild laxative (Milk of Magnesia or Miralax) should be taken according to package instructions if there has been no bowel movement after 48 hours.  You will have steri-strips and a gauze dressing over your incisions.  You may remove the gauze bandage on the second day after surgery, and you may shower at that time.  Leave your steri-strips (small skin tapes) in place directly over the incision.  These strips should remain on the skin for 5-7 days and then be removed.  You may get them wet in the shower and pat them dry.  Any sutures or staples will be removed at the office during your follow-up visit.  ACTIVITIES:  You may resume regular (light) daily activities beginning the next day - such as daily self-care, walking, climbing stairs - gradually increasing activities as tolerated.  You may have sexual intercourse when it is comfortable.  Refrain from any heavy lifting or straining until approved by your doctor.  You may drive when you are no longer taking prescription pain medication, you can comfortably wear a seatbelt, and you can safely maneuver your car and apply brakes.  You should see your  doctor in the office for a follow-up appointment approximately 2-3 weeks after your surgery.  Make sure that you call for this appointment within a day or two after you arrive home to insure a convenient appointment time.  WHEN TO CALL YOUR DOCTOR: Fever over 101.0 Inability to urinate Continued bleeding from incision Increased pain,  redness, or drainage from the incision Increasing abdominal pain  The clinic staff is available to answer your questions during regular business hours.  Please don't hesitate to call and ask to speak to one of the nurses for clinical concerns.  If you have a medical emergency, go to the nearest emergency room or call 911.  [facility] from [facility] is always on call for the hospital.  Erica [name]. Demeka Sutter, [name], [facility] Office: [phone] Toll Free:  [phone] FAX [phone]  Website: [email or web address]   Increase activity slowly    Complete by:  As directed    Remove dressing in 24 hours    Complete by:  As directed      Allergies as of 09/11/2016   No Known Allergies     Medication List    TAKE these medications   acetaminophen 500 MG tablet Commonly known as:  TYLENOL Take 1,000 mg by mouth every 6 (six) hours as needed for moderate pain or headache.   HAIR/SKIN/NAILS PO Take 2 tablets by mouth daily at 3 pm.   HYDROcodone-acetaminophen 5-325 MG tablet Commonly known as:  NORCO/VICODIN Take 1-2 tablets by mouth every 4 (four) hours as needed for moderate pain.      Follow-up Information    Darnell LevelGerkin, Erica Schueler, [name]. Schedule an appointment as soon as possible for a visit in 3 week(s).   Specialty:  General Surgery Contact information: [address] [place] [address] [phone]           Erica [name], [facility] Office: [phone]   Signed: Velora HecklerGERKIN,Erica [name] 09/11/2016, 7:52 AM

## 2016-09-11 NOTE — Anesthesia Postprocedure Evaluation (Signed)
Anesthesia Post Note  Patient: Donyelle Pineda-Reyes  Procedure(s) Performed: Procedure(s) (LRB): LAPAROSCOPIC CHOLECYSTECTOMY WITH INTRAOPERATIVE CHOLANGIOGRAM (N/A)     Patient location during evaluation: PACU Anesthesia Type: General Level of consciousness: awake and alert Pain management: pain level controlled Vital Signs Assessment: post-procedure vital signs reviewed and stable Respiratory status: spontaneous breathing, nonlabored ventilation, respiratory function stable and patient connected to nasal cannula oxygen Cardiovascular status: blood pressure returned to baseline and stable Postop Assessment: no signs of nausea or vomiting Anesthetic complications: no    Last Vitals:  Vitals:   09/10/16 2127 09/11/16 0553  BP: 110/65 110/61  Pulse: 81 71  Resp:    Temp: (!) 36.3 C (!) 36.4 C    Last Pain:  Vitals:   09/11/16 1037  TempSrc:   PainSc: 0-No pain                 Izac Faulkenberry EDWARD

## 2016-11-29 ENCOUNTER — Encounter: Payer: Self-pay | Admitting: Urgent Care

## 2016-11-29 ENCOUNTER — Ambulatory Visit (INDEPENDENT_AMBULATORY_CARE_PROVIDER_SITE_OTHER): Payer: Managed Care, Other (non HMO)

## 2016-11-29 ENCOUNTER — Ambulatory Visit (INDEPENDENT_AMBULATORY_CARE_PROVIDER_SITE_OTHER): Payer: Managed Care, Other (non HMO) | Admitting: Urgent Care

## 2016-11-29 VITALS — BP 124/78 | HR 95 | Temp 98.8°F | Resp 17 | Ht 62.0 in | Wt 159.0 lb

## 2016-11-29 DIAGNOSIS — M545 Low back pain, unspecified: Secondary | ICD-10-CM

## 2016-11-29 DIAGNOSIS — N289 Disorder of kidney and ureter, unspecified: Secondary | ICD-10-CM

## 2016-11-29 DIAGNOSIS — N1330 Unspecified hydronephrosis: Secondary | ICD-10-CM

## 2016-11-29 DIAGNOSIS — N23 Unspecified renal colic: Secondary | ICD-10-CM

## 2016-11-29 DIAGNOSIS — N12 Tubulo-interstitial nephritis, not specified as acute or chronic: Secondary | ICD-10-CM

## 2016-11-29 DIAGNOSIS — N3090 Cystitis, unspecified without hematuria: Secondary | ICD-10-CM

## 2016-11-29 DIAGNOSIS — N200 Calculus of kidney: Secondary | ICD-10-CM | POA: Diagnosis not present

## 2016-11-29 LAB — POCT URINALYSIS DIP (MANUAL ENTRY)
BILIRUBIN UA: NEGATIVE
GLUCOSE UA: NEGATIVE mg/dL
Ketones, POC UA: NEGATIVE mg/dL
NITRITE UA: POSITIVE — AB
Protein Ur, POC: 100 mg/dL — AB
SPEC GRAV UA: 1.02 (ref 1.010–1.025)
Urobilinogen, UA: 0.2 E.U./dL
pH, UA: 7.5 (ref 5.0–8.0)

## 2016-11-29 LAB — POC MICROSCOPIC URINALYSIS (UMFC)

## 2016-11-29 MED ORDER — CIPROFLOXACIN HCL 500 MG PO TABS: 500.0000 mg | ORAL_TABLET | Freq: Two times a day (BID) | ORAL | 0 refills | Status: DC

## 2016-11-29 MED ORDER — HYDROCODONE-ACETAMINOPHEN 5-325 MG PO TABS: 1.0000 | ORAL_TABLET | ORAL | 0 refills | Status: DC | PRN

## 2016-11-29 MED ORDER — ONDANSETRON 8 MG PO TBDP: 8.0000 mg | ORAL_TABLET | Freq: Three times a day (TID) | ORAL | 0 refills | Status: DC | PRN

## 2016-11-29 MED ORDER — CEFTRIAXONE SODIUM 1 G IJ SOLR
1.0000 g | Freq: Once | INTRAMUSCULAR | Status: AC
  Administered 2016-11-29: 1 g via INTRAMUSCULAR

## 2016-11-29 NOTE — Progress Notes (Signed)
MRN: [id number] DOB: [birth year]  Subjective:   Erica [name] is a 36 y.o. female presenting for chief complaint of Back Pain (lower )  Reports 3 month history of cholecystectomy. Was also found to have renal stones, CT from 08/18/2016 shows, "Large staghorn calculus of the right kidney with chronic obstruction of the right renal pelvis and upper pole caliectasis and parenchymal atrophy. There is mild right hydroureter of indeterminate chronicity." Today, she reports sudden onset of right flank and mid-low back pain. Has associated nausea without vomiting. Denies fever, abdominal pain, dysuria, hematuria, urinary frequency. Hydrates very well. Patient is aware to the CT findings and is looking to get a consult.   Erica [name] has a current medication list which includes the following prescription(s): acetaminophen, biotin w/ vitamins c & e, and hydrocodone-acetaminophen. Also has No Known Allergies.  Erica [name]  has a past medical history of Anemia; Cholecystitis (08/2016); History of kidney stones; UTI (urinary tract infection); Hypertension; and SVD (spontaneous vaginal delivery). Also  has a past surgical history that includes Laparoscopic bilateral salpingectomy (Bilateral, 08/12/2016); Tubal ligation; Cholecystectomy (09/10/2016); and Cholecystectomy (N/A, 09/10/2016).  Objective:   Vitals: BP 124/78   Pulse 95   Temp 98.8 F (37.1 C) (Oral)   Resp 17   Ht  (1.575 m)   Wt 159 lb (72.1 kg)   SpO2 98%   BMI 29.08 kg/m   Physical Exam  Constitutional: She is oriented to person, place, and time. She appears well-developed and well-nourished.  Cardiovascular: Normal rate, regular rhythm and intact distal pulses.  Exam reveals no gallop and no friction rub.   No murmur heard. Pulmonary/Chest: No respiratory distress. She has no wheezes. She has no rales.  Abdominal: Soft. Bowel sounds are normal. She exhibits no distension and no mass. There is tenderness (right sided with radiation  toward her back). There is no guarding.  Right-sided CVA tenderness.  Neurological: She is alert and oriented to person, place, and time.  Skin: Skin is warm and dry.   Dg Abd 1 View  Result Date: 11/29/2016 CLINICAL DATA:  RIGHT flank pain. EXAM: ABDOMEN - 1 VIEW COMPARISON:  Multiple priors. 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 Cholecystectomy clips. Moderate stool burden. No acute osseous findings. IMPRESSION: As above. Electronically Signed   By: Elsie Stain M.D.   On: 11/29/2016 11:41   Results for orders placed or performed in visit on 11/29/16 (from the past 24 hour(s))  POCT urinalysis dipstick     Status: Abnormal   Collection Time: 11/29/16 11:15 AM  Result Value Ref Range   Color, UA yellow yellow   Clarity, UA cloudy (A) clear   Glucose, UA negative negative mg/dL   Bilirubin, UA negative negative   Ketones, POC UA negative negative mg/dL   Spec Grav, UA 0.865 7.846 - 1.025   Blood, UA large (A) negative   pH, UA 7.5 5.0 - 8.0   Protein Ur, POC =100 (A) negative mg/dL   Urobilinogen, UA 0.2 0.2 or 1.0 E.U./dL   Nitrite, UA Positive (A) Negative   Leukocytes, UA Large (3+) (A) Negative  POCT Microscopic Urinalysis ([facility])     Status: Abnormal   Collection Time: 11/29/16 11:20 AM  Result Value Ref Range   WBC,UR,HPF,POC Too numerous to count  (A) None WBC/hpf   RBC,UR,HPF,POC Too numerous to count  (A) None RBC/hpf   Bacteria Many (A) None, Too numerous to count   Mucus Present (A) Absent   Epithelial Cells, UR Per Microscopy  None None, Too numerous to count cells/hpf   Assessment and Plan :   1. Staghorn renal calculus 2. Hydronephrosis, unspecified hydronephrosis type 3. Parenchymal disease of kidney 4. Renal colic on right side 5. Acute bilateral low back pain without sciatica 6. Cystitis 7. Pyelonephritis - KUB was ordered to evaluate for acute findings or changes but no comments were made in this regard. Patient will be treated for  pyelonephritis. Referral to urology is pending. Return-to-clinic precautions discussed, patient verbalized understanding. Offered patient hydrocodone in the meantime in case her renal colic or pyelo becomes significantly worse. ER precautions also reviewed.  Wallis Bamberg, PA-C [facility] at [facility] [phone] 11/29/2016  11:06 AM

## 2016-11-29 NOTE — Patient Instructions (Addendum)
Clico renal (Renal Colic) El clico renal es un dolor causado por el paso de un clculo en el rin. El dolor puede ser agudo e intenso. Puede sentirse en la espalda, el abdomen, al costado (fosa lumbar) o la ingle. Puede causar nuseas. El clico renal puede aparecer y Geneticist, molecular. INSTRUCCIONES PARA EL CUIDADO EN EL HOGAR Controle su afeccin para ver si hay cambios. Las siguientes medidas pueden servir para Paramedic cualquier molestia que est sintiendo:  Tome los medicamentos solamente como se lo haya indicado el mdico.  Pregntele al mdico si puede tomar analgsicos de [place].  Beba suficiente lquido para Photographer orina clara o de color amarillo plido. Albesa Seen entre 6 y 8vasos de agua por Futures trader.  Limite la cantidad de sal que consume a menos de 2gramos por da.  Reduzca la cantidad de protenas de la dieta. Consuma menos carne, pescado, frutos secos y productos lcteos.  Evite alimentos como la espinaca, el ruibarbo, los frutos secos o el salvado, ya que pueden aumentar la probabilidad de que se formen clculos. SOLICITE ATENCIN MDICA SI:  Tiene fiebre o siente escalofros.  La orina se torna de color turbio o tiene [employer].  Siente dolor o ardor al Geographical information systems officer. SOLICITE ATENCIN MDICA DE INMEDIATO SI:  El dolor en la fosa lumbar o la ingle se intensifica repentinamente.  Est confundido o desorientado, o pierde la conciencia. Esta informacin no tiene Theme park manager el consejo del mdico. Asegrese de hacerle al mdico cualquier pregunta que tenga. Document Released: 11/21/2004 Document Revised: 03/04/2014 Document Reviewed: 12/22/2013 Elsevier Interactive Patient Education  2018 [employer].    Pielonefritis en los adultos (Pyelonephritis, Adult) La pielonefritis es una infeccin del rin. Los riones son los rganos que filtran la sangre y [employer] residuos del torrente sanguneo a travs de la orina. La orina pasa desde los riones, a travs de los  urteres, Wellsite geologist la vejiga. Hay dos tipos principales de pielonefritis:  Infecciones que se inician rpidamente sin sntomas previos (pielonefritis aguda).  Infecciones que persisten durante un perodo prolongado (pielonefritis crnica). En la [employer], la infeccin desaparece con el tratamiento y no causa otros problemas. Las infecciones ms graves o crnicas a veces pueden propagarse al torrente sanguneo u ocasionar otros problemas en los riones. CAUSAS Por lo general, entre las causas de esta afeccin, se incluyen las siguientes:  Bacterias que pasan desde la vejiga al rin a travs de la orina infectada. La orina de la vejiga puede infectarse por bacterias relacionadas con estas causas: ? Infeccin en la vejiga (cistitis). ? Inflamacin de la prstata (prostatitis). ? Relaciones sexuales en las mujeres.  Bacterias que pasan del torrente sanguneo al rin. FACTORES DE RIESGO Es ms probable que esta afeccin se manifieste en:  Las embarazadas.  Las personas de edad avanzada.  Los diabticos.  Las personas que tienen clculos en los riones o la vejiga.  Las personas que tienen otras anomalas en el rin o los urteres.  Las personas que tienen una sonda vesical.  Las personas con Database administrator.  Las personas que son sexualmente activas.  Las mujeres que usan espermicidas.  Las personas que han tenido una infeccin previa en las vas [place]. SNTOMAS Los sntomas de esta afeccin incluyen lo siguiente:  Ganas frecuentes de Geographical information systems officer.  Necesidad intensa o persistente de Geographical information systems officer.  Sensacin de ardor o escozor al [employer].  Dolor abdominal.  Dolor de espalda.  Dolor al costado del cuerpo o en la fosa lumbar.  Grant Ruts.  Escalofros.  Sangre en la [place].  Nuseas.  Vmitos. DIAGNSTICO Esta afeccin se puede diagnosticar en funcin de lo siguiente:  Examen fsico e historia clnica.  Anlisis de [place].  Anlisis de [place]. Tambin  pueden [employer] de diagnstico por imgenes de los riones, por [place], una ecografa o una tomografa computarizada. TRATAMIENTO El tratamiento de esta afeccin puede depender de la gravedad de la infeccin.  Si la infeccin es leve y se detecta rpidamente, pueden administrarle antibiticos por va oral. Deber tomar lquido para permanecer hidratado.  Si la infeccin es ms grave, es posible que deban hospitalizarlo para administrarle antibiticos directamente en una vena a travs de una va intravenosa (IV). Quizs tambin deban administrarle lquidos a travs de una va intravenosa si no se encuentra bien hidratado. Despus de la hospitalizacin, es posible que deba tomar antibiticos durante un [place]. Podrn prescribirle otros tratamientos segn la causa de la infeccin. INSTRUCCIONES PARA EL CUIDADO EN EL HOGAR Medicamentos  [employer] de venta libre y los recetados solamente como se lo haya indicado el mdico.  Si le recetaron un antibitico, tmelo como se lo haya indicado el mdico. No deje de tomar los antibiticos aunque comience a Actor. Instrucciones generales  Beba suficiente lquido para mantener la orina clara o de color amarillo plido.  Evite la cafena, el t y las [address]. Estas sustancias irritan la vejiga.  Orine con frecuencia. Evite retener la orina durante largos perodos.  Orine antes y despus de las [employer].  Despus de defecar, las mujeres deben higienizarse la regin perineal desde adelante hacia atrs. Use cada trozo de papel higinico solo una vez.  Concurra a todas las visitas de control como se lo haya indicado el mdico. Esto es importante. SOLICITE ATENCIN MDICA SI:  Los sntomas no mejoran despus de 2das de tratamiento.  Los sntomas empeoran.  Tiene fiebre. SOLICITE ATENCIN MDICA DE INMEDIATO SI:  No puede tomar los antibiticos ni ingerir lquidos.  Comienza a sentir  escalofros.  Vomita.  Siente un dolor intenso en la espalda o en la fosa lumbar.  Se desmaya o siente una debilidad extrema. Esta informacin no tiene Theme park manager el consejo del mdico. Asegrese de hacerle al mdico cualquier pregunta que tenga. Document Released: 11/21/2004 Document Revised: 11/02/2014 Document Reviewed: 06/06/2014 Elsevier Interactive Patient Education  2018 [employer].     IF you received an x-ray today, you will receive an invoice from [facility]. Please contact [facility] at [phone] with questions or concerns regarding your invoice.   IF you received labwork today, you will receive an invoice from [facility]. Please contact [facility] at [phone] with questions or concerns regarding your invoice.   Our billing staff will not be able to assist you with questions regarding bills from these companies.  You will be contacted with the lab results as soon as they are available. The fastest way to get your results is to activate your My Chart account. Instructions are located on the last page of this paperwork. If you have not heard from [place] regarding the results in 2 weeks, please contact this office.

## 2016-12-01 LAB — URINE CULTURE

## 2016-12-09 DIAGNOSIS — K8061 Calculus of gallbladder and bile duct with cholecystitis, unspecified, with obstruction: Secondary | ICD-10-CM

## 2016-12-16 ENCOUNTER — Other Ambulatory Visit (HOSPITAL_COMMUNITY): Payer: Self-pay | Admitting: Urology

## 2016-12-16 ENCOUNTER — Other Ambulatory Visit: Payer: Self-pay | Admitting: Urology

## 2016-12-16 DIAGNOSIS — N200 Calculus of kidney: Secondary | ICD-10-CM

## 2016-12-27 NOTE — Patient Instructions (Addendum)
Sonny MastersYovani Pineda-Reyes  12/27/2016   Your procedure is scheduled on: 12-31-16  [facility]  Entrance [place]  elevators to 3rd floor to  Short Stay Center at 730AM.   Call this number if you have problems the morning of surgery [phone]    Remember: ONLY 1 PERSON MAY GO WITH YOU TO SHORT STAY TO GET  READY MORNING OF YOUR SURGERY.    Do not eat food or drink liquids :After Midnight.     Take these medicines the morning of surgery with A SIP OF WATER: tylenol if needed                                You may not have any metal on your body including hair pins and              piercings  Do not wear jewelry, make-up, lotions, powders or perfumes, deodorant             Do not wear nail polish.  Do not shave  48 hours prior to surgery.            Do not bring valuables to the hospital. [place] IS NOT             RESPONSIBLE   FOR VALUABLES.  Contacts, dentures or bridgework may not be worn into surgery.  Leave suitcase in the car. After surgery it may be brought to your room.                 Please read over the following fact sheets you were given: _____________________________________________________________________            [facility] - Preparing [facility] Before surgery, you can play an important role.  Because skin is not sterile, your skin needs to be as free of germs as possible.  You can reduce the number of germs on your skin by washing with CHG (chlorahexidine gluconate) soap before surgery.  CHG is an antiseptic cleaner which kills germs and bonds with the skin to continue killing germs even after washing. Please DO NOT use if you have an allergy to CHG or antibacterial soaps.  If your skin becomes reddened/irritated stop using the CHG and inform your nurse when you arrive at Short Stay. Do not shave (including legs and underarms) for at least 48 hours prior to the first CHG shower.  You may shave your face/neck. Please  follow these instructions carefully:  1.  Shower with CHG Soap the night before surgery and the  morning of Surgery.  2.  If you choose to wash your hair, wash your hair first as usual with your  normal  shampoo.  3.  After you shampoo, rinse your hair and body thoroughly to remove the  shampoo.                           4.  Use CHG as you would any other liquid soap.  You can apply chg directly  to the skin and wash                       Gently with a scrungie or clean washcloth.  5.  Apply the CHG Soap to your body ONLY FROM THE NECK DOWN.  Do not use on face/ open                           Wound or open sores. Avoid contact with eyes, ears mouth and genitals (private parts).                       Wash face,  Genitals (private parts) with your normal soap.             6.  Wash thoroughly, paying special attention to the area where your surgery  will be performed.  7.  Thoroughly rinse your body with warm water from the neck down.  8.  DO NOT shower/wash with your normal soap after using and rinsing off  the CHG Soap.                9.  Pat yourself dry with a clean towel.            10.  Wear clean pajamas.            11.  Place clean sheets on your bed the night of your first shower and do not  sleep with pets. Day of Surgery : Do not apply any lotions/deodorants the morning of surgery.  Please wear clean clothes to the hospital/surgery center.  FAILURE TO FOLLOW THESE INSTRUCTIONS MAY RESULT IN THE CANCELLATION OF YOUR SURGERY PATIENT SIGNATURE_________________________________  NURSE SIGNATURE__________________________________  ________________________________________________________________________

## 2016-12-27 NOTE — Progress Notes (Signed)
EKG 09-10-16 epic

## 2016-12-30 ENCOUNTER — Encounter (HOSPITAL_COMMUNITY)
Admission: RE | Admit: 2016-12-30 | Discharge: 2016-12-30 | Disposition: A | Discharge status: Home / Self Care | Payer: Managed Care, Other (non HMO) | Source: Ambulatory Visit | Attending: Urology | Admitting: Urology

## 2016-12-30 ENCOUNTER — Encounter (INDEPENDENT_AMBULATORY_CARE_PROVIDER_SITE_OTHER): Payer: Self-pay

## 2016-12-30 ENCOUNTER — Other Ambulatory Visit: Payer: Self-pay

## 2016-12-30 ENCOUNTER — Encounter (HOSPITAL_COMMUNITY): Payer: Self-pay

## 2016-12-30 ENCOUNTER — Other Ambulatory Visit: Payer: Self-pay | Admitting: Student

## 2016-12-30 DIAGNOSIS — N200 Calculus of kidney: Secondary | ICD-10-CM | POA: Diagnosis not present

## 2016-12-30 DIAGNOSIS — I10 Essential (primary) hypertension: Secondary | ICD-10-CM | POA: Diagnosis not present

## 2016-12-30 LAB — COMPREHENSIVE METABOLIC PANEL
ALBUMIN: 4 g/dL (ref 3.5–5.0)
ALK PHOS: 75 U/L (ref 38–126)
ALT: 17 U/L (ref 14–54)
ANION GAP: 8 (ref 5–15)
AST: 16 U/L (ref 15–41)
BUN: 11 mg/dL (ref 6–20)
CALCIUM: 8.9 mg/dL (ref 8.9–10.3)
CHLORIDE: 106 mmol/L (ref 101–111)
CO2: 27 mmol/L (ref 22–32)
Creatinine, Ser: 0.86 mg/dL (ref 0.44–1.00)
GFR calc Af Amer: >60 mL/min (ref >60)
GFR calc non Af Amer: >60 mL/min (ref >60)
GLUCOSE: 107 mg/dL — AB (ref 65–99)
POTASSIUM: 3.6 mmol/L (ref 3.5–5.1)
SODIUM: 141 mmol/L (ref 135–145)
Total Bilirubin: 0.4 mg/dL (ref 0.3–1.2)
Total Protein: 7.6 g/dL (ref 6.5–8.1)

## 2016-12-30 LAB — CBC
HEMATOCRIT: 34.6 % — AB (ref 36.0–46.0)
Hemoglobin: 11.2 g/dL — ABNORMAL LOW (ref 12.0–15.0)
MCH: 25.2 pg — ABNORMAL LOW (ref 26.0–34.0)
MCHC: 32.4 g/dL (ref 30.0–36.0)
MCV: 77.9 fL — AB (ref 78.0–100.0)
PLATELETS: 181 10*3/uL (ref 150–400)
RBC: 4.44 MIL/uL (ref 3.87–5.11)
RDW: 12.9 % (ref 11.5–15.5)
WBC: 5.9 10*3/uL (ref 4.0–10.5)

## 2016-12-30 LAB — PROTIME-INR
INR: 0.95
Prothrombin Time: 12.6 seconds (ref 11.4–15.2)

## 2016-12-30 LAB — APTT: APTT: 31 s (ref 24–36)

## 2016-12-30 LAB — HCG, SERUM, QUALITATIVE: Preg, Serum: NEGATIVE

## 2016-12-30 NOTE — H&P (Signed)
Urology Preoperative H&P   Chief Complaint: Right flank pain  History of Present Illness: Erica [name] is a 36 y.o. female  who is here for renal calculi.  The problem is on the right side. She first stated noticing pain on approximately 07/26/2016. This is her first kidney stone. She is currently having flank pain. She denies having back pain, groin pain, nausea, vomiting, fever, and chills. She has not caught a stone in her urine strainer since her symptoms began.   She has never had surgical treatment for calculi in the past.   Right staghorn stone seen on CT abd/pel with contrast (07/2016) and on KUB from 11/29/16. She presents today with ongoing right flank pain. She states that she will get 1-2 UTIs/year. Currently on 5 day course of cipro. She denies fever/chills, dysuria or gross hematuria. No prior history of stones.    Past Medical History:  Diagnosis Date  . Anemia   . Cholecystitis 08/2016  . History of kidney stones   . Hx: UTI (urinary tract infection)   . Hypertension    borderline at [facility] referred to PCP but patient did not follow up, no meds  . SVD (spontaneous vaginal delivery)    x 3    Past Surgical History:  Procedure Laterality Date  . CHOLECYSTECTOMY  09/10/2016  . TUBAL LIGATION      Allergies: No Known Allergies  Family History  Problem Relation Age of Onset  . High Cholesterol Mother     Social History:  reports that  has never smoked. she has never used smokeless tobacco. She reports that she does not drink alcohol or use drugs.  ROS: A complete review of systems was performed.  All systems are negative except for pertinent findings as noted.  Physical Exam:  Vital signs in last 24 hours: BP: ()/()  Arterial Line BP: ()/()  Constitutional:  Alert and oriented, No acute distress Cardiovascular: Regular rate and rhythm, No JVD Respiratory: Normal respiratory effort, Lungs clear bilaterally GI: Abdomen is soft, nontender, nondistended,  no abdominal masses GU: No CVA tenderness Lymphatic: No lymphadenopathy Neurologic: Grossly intact, no focal deficits Psychiatric: Normal mood and affect  Laboratory Data:  No results for input(s): WBC, HGB, HCT, PLT in the last 72 hours.  No results for input(s): NA, K, CL, GLUCOSE, BUN, CALCIUM, CREATININE in the last 72 hours.  Invalid input(s): CO3   No results found for this or any previous visit (from the past 24 hour(s)). No results found for this or any previous visit (from the past 240 hour(s)).  Renal Function: No results for input(s): CREATININE in the last 168 hours. CrCl cannot be calculated (Patient's most recent lab result is older than the maximum 21 days allowed.).  Radiologic Imaging: No results found.  I independently reviewed the above imaging studies.  Assessment and Plan Erica [name] is a 36 y.o. female with right staghorn stone  We discussed the management of urinary stones. These options include observation, ureteroscopy, shockwave lithotripsy, and PCNL. We discussed which options are relevant to these particular stones. We discussed the natural history of stones as well as the complications of untreated stones and the impact on quality of life without treatment as well as with each of the above listed treatments. We also discussed the efficacy of each treatment in its ability to clear the stone burden. With any of these management options I discussed the signs and symptoms of infection and the need for emergent treatment should these be experienced. For  each option we discussed the ability of each procedure to clear the patient of their stone burden.  For observation I described the risks which include but are not limited to silent renal damage, life-threatening infection, need for emergent surgery, failure to pass stone, and pain.  For ureteroscopy I described the risks which include heart attack, stroke, pulmonary embolus, death, bleeding, infection,  damage to contiguous structures, positioning injury, ureteral stricture, ureteral avulsion, ureteral injury, need for ureteral stent, inability to perform ureteroscopy, need for an interval procedure, inability to clear stone burden, stent discomfort and pain.  For shockwave lithotripsy I described the risks which include arrhythmia, kidney contusion, kidney hemorrhage, need for transfusion, pain, inability to break up stone, inability to pass stone fragments, Steinstrasse, infection associated with obstructing stones, need for different surgical procedure, need for repeat shockwave lithotripsy, and death.  For PCNL I described the risks including heart attack, pulmonary embolus, death, positioning injury, pneumothorax, hydrothorax, need for chest tube, inability to clear stone burden, renal laceration, arterial venous fistula or malformation, need for embolization of kidney, loss of kidney or renal function, need for repeat procedure, need for prolonged nephrostomy tube, ureteral avulsion.   -Will proceed with right PCNL given the size of her stone. Interventional radiology has been consulted to obtain right renal access and place a right nephroureteral catheter prior to the operation (same day). She voices understanding and wishes to proceed.   Rhoderick Moodyhristopher Yanis Larin, MD 12/30/2016, 1:10 PM  [facility] Pager: [phone]

## 2016-12-31 ENCOUNTER — Inpatient Hospital Stay (HOSPITAL_COMMUNITY): Payer: Managed Care, Other (non HMO) | Admitting: Registered Nurse

## 2016-12-31 ENCOUNTER — Inpatient Hospital Stay (HOSPITAL_COMMUNITY)
Admission: RE | Admit: 2016-12-31 | Discharge: 2016-12-31 | Disposition: A | Discharge status: Home / Self Care | Payer: Managed Care, Other (non HMO) | Source: Ambulatory Visit | Attending: Urology | Admitting: Urology

## 2016-12-31 ENCOUNTER — Observation Stay (HOSPITAL_COMMUNITY)
Admission: RE | Admit: 2016-12-31 | Discharge: 2017-01-01 | Disposition: A | Discharge status: Home / Self Care | Payer: Managed Care, Other (non HMO) | Source: Ambulatory Visit | Attending: Urology | Admitting: Urology

## 2016-12-31 ENCOUNTER — Encounter (HOSPITAL_COMMUNITY): Payer: Self-pay

## 2016-12-31 ENCOUNTER — Inpatient Hospital Stay (HOSPITAL_COMMUNITY): Payer: Managed Care, Other (non HMO)

## 2016-12-31 ENCOUNTER — Ambulatory Visit (HOSPITAL_COMMUNITY)
Admission: RE | Admit: 2016-12-31 | Discharge: 2016-12-31 | Disposition: A | Discharge status: Home / Self Care | Payer: Managed Care, Other (non HMO) | Source: Ambulatory Visit | Attending: Urology | Admitting: Urology

## 2016-12-31 ENCOUNTER — Encounter (HOSPITAL_COMMUNITY)
Admission: RE | Disposition: A | Discharge status: Home / Self Care | Payer: Self-pay | Source: Ambulatory Visit | Attending: Urology

## 2016-12-31 DIAGNOSIS — N12 Tubulo-interstitial nephritis, not specified as acute or chronic: Secondary | ICD-10-CM

## 2016-12-31 DIAGNOSIS — N3090 Cystitis, unspecified without hematuria: Secondary | ICD-10-CM

## 2016-12-31 DIAGNOSIS — Z419 Encounter for procedure for purposes other than remedying health state, unspecified: Secondary | ICD-10-CM

## 2016-12-31 DIAGNOSIS — N200 Calculus of kidney: Secondary | ICD-10-CM

## 2016-12-31 DIAGNOSIS — I10 Essential (primary) hypertension: Secondary | ICD-10-CM | POA: Insufficient documentation

## 2016-12-31 HISTORY — PX: NEPHROLITHOTOMY: SHX5134

## 2016-12-31 HISTORY — PX: IR URETERAL STENT RIGHT NEW ACCESS W/O SEP NEPHROSTOMY CATH: IMG6076

## 2016-12-31 LAB — HEMOGLOBIN AND HEMATOCRIT, BLOOD
HCT: 30.6 % — ABNORMAL LOW (ref 36.0–46.0)
Hemoglobin: 10 g/dL — ABNORMAL LOW (ref 12.0–15.0)

## 2016-12-31 SURGERY — NEPHROLITHOTOMY PERCUTANEOUS
Anesthesia: General | Laterality: Right

## 2016-12-31 MED ORDER — CEFAZOLIN SODIUM-DEXTROSE 2-4 GM/100ML-% IV SOLN
INTRAVENOUS | Status: AC
  Filled 2016-12-31: qty 100

## 2016-12-31 MED ORDER — OXYCODONE HCL 5 MG PO TABS: 5.0000 mg | ORAL_TABLET | Freq: Once | ORAL | Status: DC | PRN

## 2016-12-31 MED ORDER — HYDROCODONE-ACETAMINOPHEN 5-325 MG PO TABS: 1.0000 | ORAL_TABLET | ORAL | 0 refills | Status: DC | PRN

## 2016-12-31 MED ORDER — FENTANYL CITRATE (PF) 100 MCG/2ML IJ SOLN
INTRAMUSCULAR | Status: AC
  Filled 2016-12-31: qty 2

## 2016-12-31 MED ORDER — OXYBUTYNIN CHLORIDE 5 MG PO TABS: 5.0000 mg | ORAL_TABLET | Freq: Three times a day (TID) | ORAL | Status: DC | PRN

## 2016-12-31 MED ORDER — CEFAZOLIN SODIUM-DEXTROSE 2-4 GM/100ML-% IV SOLN
2.0000 g | Freq: Three times a day (TID) | INTRAVENOUS | Status: AC
  Administered 2016-12-31 – 2017-01-01 (×3): 2 g via INTRAVENOUS
  Filled 2016-12-31 (×3): qty 100

## 2016-12-31 MED ORDER — ONDANSETRON HCL 4 MG/2ML IJ SOLN
INTRAMUSCULAR | Status: AC
  Filled 2016-12-31: qty 2

## 2016-12-31 MED ORDER — SUGAMMADEX SODIUM 200 MG/2ML IV SOLN
INTRAVENOUS | Status: DC | PRN
  Administered 2016-12-31: 150 mg via INTRAVENOUS

## 2016-12-31 MED ORDER — ONDANSETRON 8 MG PO TBDP: 8.0000 mg | ORAL_TABLET | Freq: Three times a day (TID) | ORAL | 0 refills | Status: DC | PRN

## 2016-12-31 MED ORDER — DIPHENHYDRAMINE HCL 12.5 MG/5ML PO ELIX: 12.5000 mg | ORAL_SOLUTION | Freq: Four times a day (QID) | ORAL | Status: DC | PRN

## 2016-12-31 MED ORDER — BUPIVACAINE-EPINEPHRINE (PF) 0.5% -1:200000 IJ SOLN
INTRAMUSCULAR | Status: AC
  Filled 2016-12-31: qty 30

## 2016-12-31 MED ORDER — MIDAZOLAM HCL 2 MG/2ML IJ SOLN
INTRAMUSCULAR | Status: AC | PRN
  Administered 2016-12-31 (×4): 1 mg via INTRAVENOUS

## 2016-12-31 MED ORDER — BUPIVACAINE-EPINEPHRINE (PF) 0.5% -1:200000 IJ SOLN
INTRAMUSCULAR | Status: DC | PRN
  Administered 2016-12-31: 20 mL

## 2016-12-31 MED ORDER — CIPROFLOXACIN HCL 500 MG PO TABS: 500.0000 mg | ORAL_TABLET | Freq: Two times a day (BID) | ORAL | 0 refills | Status: AC

## 2016-12-31 MED ORDER — LACTATED RINGERS IV SOLN
INTRAVENOUS | Status: DC
  Administered 2016-12-31: 08:00:00 via INTRAVENOUS

## 2016-12-31 MED ORDER — ROCURONIUM BROMIDE 50 MG/5ML IV SOSY
PREFILLED_SYRINGE | INTRAVENOUS | Status: AC
  Filled 2016-12-31: qty 5

## 2016-12-31 MED ORDER — IOPAMIDOL (ISOVUE-300) INJECTION 61%
INTRAVENOUS | Status: AC
  Administered 2016-12-31: 40 mL
  Filled 2016-12-31: qty 100

## 2016-12-31 MED ORDER — HYDROMORPHONE HCL 1 MG/ML IJ SOLN
0.5000 mg | INTRAMUSCULAR | Status: DC | PRN
  Administered 2016-12-31: 0.5 mg via INTRAVENOUS
  Filled 2016-12-31: qty 0.5

## 2016-12-31 MED ORDER — DEXAMETHASONE SODIUM PHOSPHATE 10 MG/ML IJ SOLN
INTRAMUSCULAR | Status: DC | PRN
  Administered 2016-12-31: 10 mg via INTRAVENOUS

## 2016-12-31 MED ORDER — PROPOFOL 10 MG/ML IV BOLUS
INTRAVENOUS | Status: AC
  Filled 2016-12-31: qty 20

## 2016-12-31 MED ORDER — ACETAMINOPHEN 325 MG PO TABS
650.0000 mg | ORAL_TABLET | ORAL | Status: DC | PRN
  Administered 2017-01-01: 650 mg via ORAL
  Filled 2016-12-31: qty 2

## 2016-12-31 MED ORDER — IOPAMIDOL (ISOVUE-300) INJECTION 61%
40.0000 mL | Freq: Once | INTRAVENOUS | Status: AC | PRN
  Administered 2016-12-31: 40 mL

## 2016-12-31 MED ORDER — FENTANYL CITRATE (PF) 100 MCG/2ML IJ SOLN
INTRAMUSCULAR | Status: AC
  Filled 2016-12-31: qty 4

## 2016-12-31 MED ORDER — LIDOCAINE 2% (20 MG/ML) 5 ML SYRINGE
INTRAMUSCULAR | Status: DC | PRN
  Administered 2016-12-31: 80 mg via INTRAVENOUS

## 2016-12-31 MED ORDER — ONDANSETRON HCL 4 MG/2ML IJ SOLN: 4.0000 mg | INTRAMUSCULAR | Status: DC | PRN

## 2016-12-31 MED ORDER — LIDOCAINE HCL 1 % IJ SOLN
INTRAMUSCULAR | Status: AC | PRN
  Administered 2016-12-31 (×3): 10 mL

## 2016-12-31 MED ORDER — HYDROMORPHONE HCL 1 MG/ML IJ SOLN
0.2500 mg | INTRAMUSCULAR | Status: DC | PRN
  Administered 2016-12-31 (×3): 0.5 mg via INTRAVENOUS

## 2016-12-31 MED ORDER — HYDROMORPHONE HCL 1 MG/ML IJ SOLN: 0.2500 mg | INTRAMUSCULAR | Status: DC | PRN

## 2016-12-31 MED ORDER — PROPOFOL 10 MG/ML IV BOLUS
INTRAVENOUS | Status: DC | PRN
  Administered 2016-12-31: 140 mg via INTRAVENOUS

## 2016-12-31 MED ORDER — LIDOCAINE 2% (20 MG/ML) 5 ML SYRINGE
INTRAMUSCULAR | Status: AC
  Filled 2016-12-31: qty 5

## 2016-12-31 MED ORDER — DEXAMETHASONE SODIUM PHOSPHATE 10 MG/ML IJ SOLN
INTRAMUSCULAR | Status: AC
  Filled 2016-12-31: qty 1

## 2016-12-31 MED ORDER — SODIUM CHLORIDE 0.9 % IR SOLN
Status: DC | PRN
Start: 2016-12-31 — End: 2016-12-31
  Administered 2016-12-31: 57000 mL

## 2016-12-31 MED ORDER — BELLADONNA ALKALOIDS-OPIUM 16.2-60 MG RE SUPP: 1.0000 | Freq: Four times a day (QID) | RECTAL | Status: DC | PRN

## 2016-12-31 MED ORDER — PHENYLEPHRINE 40 MCG/ML (10ML) SYRINGE FOR IV PUSH (FOR BLOOD PRESSURE SUPPORT)
PREFILLED_SYRINGE | INTRAVENOUS | Status: AC
  Filled 2016-12-31: qty 10

## 2016-12-31 MED ORDER — PROMETHAZINE HCL 25 MG/ML IJ SOLN
6.2500 mg | INTRAMUSCULAR | Status: DC | PRN
Start: 2016-12-31 — End: 2016-12-31

## 2016-12-31 MED ORDER — OXYCODONE-ACETAMINOPHEN 5-325 MG PO TABS
1.0000 | ORAL_TABLET | ORAL | Status: DC | PRN
  Administered 2016-12-31: 1 via ORAL
  Administered 2017-01-01: 2 via ORAL
  Administered 2017-01-01: 1 via ORAL
  Filled 2016-12-31: qty 1
  Filled 2016-12-31: qty 2
  Filled 2016-12-31: qty 1

## 2016-12-31 MED ORDER — SUGAMMADEX SODIUM 200 MG/2ML IV SOLN
INTRAVENOUS | Status: AC
  Filled 2016-12-31: qty 2

## 2016-12-31 MED ORDER — FENTANYL CITRATE (PF) 100 MCG/2ML IJ SOLN
INTRAMUSCULAR | Status: DC | PRN
  Administered 2016-12-31: 25 ug via INTRAVENOUS
  Administered 2016-12-31: 50 ug via INTRAVENOUS
  Administered 2016-12-31: 25 ug via INTRAVENOUS
  Administered 2016-12-31 (×2): 50 ug via INTRAVENOUS

## 2016-12-31 MED ORDER — LIDOCAINE HCL 1 % IJ SOLN
INTRAMUSCULAR | Status: AC
  Filled 2016-12-31: qty 40

## 2016-12-31 MED ORDER — CEFAZOLIN SODIUM-DEXTROSE 2-4 GM/100ML-% IV SOLN
2.0000 g | INTRAVENOUS | Status: AC
  Administered 2016-12-31: 2 g via INTRAVENOUS

## 2016-12-31 MED ORDER — HYDROMORPHONE HCL 1 MG/ML IJ SOLN
INTRAMUSCULAR | Status: AC
Start: 2016-12-31 — End: 2017-01-01
  Filled 2016-12-31: qty 1

## 2016-12-31 MED ORDER — IOHEXOL 300 MG/ML  SOLN
INTRAMUSCULAR | Status: DC | PRN
  Administered 2016-12-31: 93 mL

## 2016-12-31 MED ORDER — CIPROFLOXACIN IN D5W 400 MG/200ML IV SOLN
400.0000 mg | Freq: Once | INTRAVENOUS | Status: AC
  Administered 2016-12-31: 400 mg via INTRAVENOUS
  Filled 2016-12-31: qty 200

## 2016-12-31 MED ORDER — SODIUM CHLORIDE 0.9 % IV SOLN: INTRAVENOUS | Status: DC

## 2016-12-31 MED ORDER — MIDAZOLAM HCL 2 MG/2ML IJ SOLN
INTRAMUSCULAR | Status: AC
  Filled 2016-12-31: qty 6

## 2016-12-31 MED ORDER — SODIUM CHLORIDE 0.9 % IV SOLN
INTRAVENOUS | Status: DC
  Administered 2016-12-31 – 2017-01-01 (×2): via INTRAVENOUS

## 2016-12-31 MED ORDER — ROCURONIUM BROMIDE 10 MG/ML (PF) SYRINGE
PREFILLED_SYRINGE | INTRAVENOUS | Status: DC | PRN
  Administered 2016-12-31: 10 mg via INTRAVENOUS
  Administered 2016-12-31: 50 mg via INTRAVENOUS
  Administered 2016-12-31: 10 mg via INTRAVENOUS

## 2016-12-31 MED ORDER — ONDANSETRON HCL 4 MG/2ML IJ SOLN
INTRAMUSCULAR | Status: DC | PRN
  Administered 2016-12-31: 4 mg via INTRAVENOUS

## 2016-12-31 MED ORDER — DIPHENHYDRAMINE HCL 50 MG/ML IJ SOLN: 12.5000 mg | Freq: Four times a day (QID) | INTRAMUSCULAR | Status: DC | PRN

## 2016-12-31 MED ORDER — FENTANYL CITRATE (PF) 100 MCG/2ML IJ SOLN
INTRAMUSCULAR | Status: AC | PRN
  Administered 2016-12-31 (×4): 50 ug via INTRAVENOUS

## 2016-12-31 MED ORDER — OXYCODONE HCL 5 MG/5ML PO SOLN
5.0000 mg | Freq: Once | ORAL | Status: DC | PRN
  Filled 2016-12-31: qty 5

## 2016-12-31 MED ORDER — PHENYLEPHRINE 40 MCG/ML (10ML) SYRINGE FOR IV PUSH (FOR BLOOD PRESSURE SUPPORT)
PREFILLED_SYRINGE | INTRAVENOUS | Status: DC | PRN
  Administered 2016-12-31: 80 ug via INTRAVENOUS
  Administered 2016-12-31 (×2): 40 ug via INTRAVENOUS

## 2016-12-31 MED ORDER — CEFAZOLIN SODIUM-DEXTROSE 2-3 GM-%(50ML) IV SOLR
INTRAVENOUS | Status: DC | PRN
  Administered 2016-12-31: 2 g via INTRAVENOUS

## 2016-12-31 SURGICAL SUPPLY — 48 items
APL SKNCLS STERI-STRIP NONHPOA (GAUZE/BANDAGES/DRESSINGS) ×2
BAG URINE DRAINAGE (UROLOGICAL SUPPLIES) ×2 IMPLANT
BAG URO CATCHER STRL LF (MISCELLANEOUS) ×2 IMPLANT
BASKET ZERO TIP NITINOL 2.4FR (BASKET) ×1 IMPLANT
BENZOIN TINCTURE PRP APPL 2/3 (GAUZE/BANDAGES/DRESSINGS) ×4 IMPLANT
BSKT STON RTRVL ZERO TP 2.4FR (BASKET) ×1
CATCHER STONE W/TUBE ADAPTER (UROLOGICAL SUPPLIES) ×1 IMPLANT
CATH FOLEY 2W COUNCIL 20FR 5CC (CATHETERS) IMPLANT
CATH FOLEY 2WAY SLVR  5CC 18FR (CATHETERS) ×1
CATH FOLEY 2WAY SLVR 5CC 18FR (CATHETERS) IMPLANT
CATH INTERMIT  6FR 70CM (CATHETERS) ×2 IMPLANT
CATH ROBINSON RED A/P 20FR (CATHETERS) IMPLANT
CATH URET DUAL LUMEN 6-10FR 50 (CATHETERS) ×1 IMPLANT
CATH UROLOGY TORQUE 40 (MISCELLANEOUS) ×1 IMPLANT
CATH X-FORCE N30 NEPHROSTOMY (TUBING) ×2 IMPLANT
CLOTH BEACON ORANGE TIMEOUT ST (SAFETY) ×2 IMPLANT
COVER FOOTSWITCH UNIV (MISCELLANEOUS) ×1 IMPLANT
COVER SURGICAL LIGHT HANDLE (MISCELLANEOUS) ×2 IMPLANT
DRAPE C-ARM 42X120 X-RAY (DRAPES) ×2 IMPLANT
DRAPE LINGEMAN PERC (DRAPES) ×2 IMPLANT
DRAPE SURG IRRIG POUCH 19X23 (DRAPES) ×2 IMPLANT
DRAPE UTILITY XL STRL (DRAPES) ×2 IMPLANT
DRSG PAD ABDOMINAL 8X10 ST (GAUZE/BANDAGES/DRESSINGS) ×4 IMPLANT
DRSG TEGADERM 8X12 (GAUZE/BANDAGES/DRESSINGS) ×4 IMPLANT
GAUZE SPONGE 4X4 12PLY STRL (GAUZE/BANDAGES/DRESSINGS) ×2 IMPLANT
GLOVE BIOGEL M STRL SZ7.5 (GLOVE) ×2 IMPLANT
GOWN STRL REUS W/TWL LRG LVL3 (GOWN DISPOSABLE) ×4 IMPLANT
GUIDEWIRE STR DUAL SENSOR (WIRE) ×2 IMPLANT
KIT BASIN OR (CUSTOM PROCEDURE TRAY) ×2 IMPLANT
MANIFOLD NEPTUNE II (INSTRUMENTS) ×2 IMPLANT
NS IRRIG 1000ML POUR BTL (IV SOLUTION) ×2 IMPLANT
PACK CYSTO (CUSTOM PROCEDURE TRAY) ×2 IMPLANT
PAD ABD 8X10 STRL (GAUZE/BANDAGES/DRESSINGS) ×2 IMPLANT
PROBE LITHOCLAST ULTRA 3.8X403 (UROLOGICAL SUPPLIES) ×1 IMPLANT
PROBE PNEUMATIC 1.0MMX570MM (UROLOGICAL SUPPLIES) ×2 IMPLANT
SET IRRIG Y TYPE TUR BLADDER L (SET/KITS/TRAYS/PACK) ×2 IMPLANT
SPONGE LAP 4X18 X RAY DECT (DISPOSABLE) ×2 IMPLANT
STENT URET 6FRX24 CONTOUR (STENTS) ×1 IMPLANT
STONE CATCHER W/TUBE ADAPTER (UROLOGICAL SUPPLIES) ×2 IMPLANT
SUT SILK 2 0 30  PSL (SUTURE) ×1
SUT SILK 2 0 30 PSL (SUTURE) ×1 IMPLANT
SYR 10ML LL (SYRINGE) ×2 IMPLANT
SYR 20CC LL (SYRINGE) ×4 IMPLANT
SYRINGE 60CC LL (MISCELLANEOUS) ×1 IMPLANT
TOWEL OR 17X26 10 PK STRL BLUE (TOWEL DISPOSABLE) ×1 IMPLANT
TOWEL OR NON WOVEN STRL DISP B (DISPOSABLE) ×2 IMPLANT
TUBING CONNECTING 10 (TUBING) ×6 IMPLANT
WATER STERILE IRR 1000ML POUR (IV SOLUTION) ×1 IMPLANT

## 2016-12-31 NOTE — Anesthesia Postprocedure Evaluation (Signed)
Anesthesia Post Note  Patient: Erica [name]  Procedure(s) Performed: NEPHROLITHOTOMY PERCUTANEOUS (Right )     Anesthesia Post Evaluation  Last Vitals:  Vitals:   12/31/16 1715 12/31/16 1733  BP: 111/71 121/64  Pulse: 98   Resp: 19 20  Temp: 37.3 C 36.5 C  SpO2: 100% 100%    Last Pain:  Vitals:   12/31/16 1822  TempSrc:   PainSc: 5                  Lowella CurbWarren Ray Azucena Dart

## 2016-12-31 NOTE — Anesthesia Preprocedure Evaluation (Signed)
Anesthesia Evaluation  Patient identified by MRN, date of birth, ID band Patient awake    Reviewed: Allergy & Precautions, NPO status , Patient's Chart, lab work & pertinent test results  Airway Mallampati: II  TM Distance: >3 FB Neck ROM: Full    Dental no notable dental hx. (+) Dental Advisory Given, Teeth Intact   Pulmonary neg pulmonary ROS,    Pulmonary exam normal breath sounds clear to auscultation       Cardiovascular hypertension, Pt. on medications negative cardio ROS Normal cardiovascular exam Rhythm:Regular Rate:Normal     Neuro/Psych negative neurological ROS  negative psych ROS   GI/Hepatic negative GI ROS, Neg liver ROS,   Endo/Other  negative endocrine ROS  Renal/GU negative Renal ROS  negative genitourinary   Musculoskeletal negative musculoskeletal ROS (+)   Abdominal   Peds negative pediatric ROS (+)  Hematology negative hematology ROS (+)   Anesthesia Other Findings   Reproductive/Obstetrics negative OB ROS                             Anesthesia Physical  Anesthesia Plan  ASA: II  Anesthesia Plan: General   Post-op Pain Management:    Induction: Intravenous  PONV Risk Score and Plan: 3 and Ondansetron, Dexamethasone, Propofol and Midazolam  Airway Management Planned: Oral ETT  Additional Equipment:   Intra-op Plan:   Post-operative Plan: Extubation in OR  Informed Consent: I have reviewed the patients History and Physical, chart, labs and discussed the procedure including the risks, benefits and alternatives for the proposed anesthesia with the patient or authorized representative who has indicated his/her understanding and acceptance.   Dental advisory given  Plan Discussed with: CRNA and Anesthesiologist  Anesthesia Plan Comments:         Anesthesia Quick Evaluation

## 2016-12-31 NOTE — H&P (Signed)
Chief Complaint: Patient was seen in consultation today for right kidney stone at the request of Winter,Christopher Clifton CustardAaron  Referring Physician(s): Winter,Christopher Clifton CustardAaron  Supervising Physician: Malachy MoanMcCullough, Heath  Patient Status: [facility]  History of Present Illness: Erica [name] is a 36 y.o. female with large right renal calculus. She is scheduled for PCNL today with Dr. Liliane ShiWinter and IR is asked to obtain percutaneous nephrostomy access. PMHx, meds, labs, imaging reviewed. Has been NPO this am. Feels well. Husband at bedisde  Past Medical History:  Diagnosis Date  . Anemia   . Cholecystitis 08/2016  . History of kidney stones   . Hx: UTI (urinary tract infection)   . Hypertension    [facility]was referred to PCP but patient did not follow up, no meds  . SVD (spontaneous vaginal delivery)    x 3    Past Surgical History:  Procedure Laterality Date  . CHOLECYSTECTOMY  09/10/2016  . TUBAL LIGATION      Allergies: Patient has no known allergies.  Medications: Prior to Admission medications   Medication Sig Start Date End Date Taking? Authorizing Provider  acetaminophen (TYLENOL) 500 MG tablet Take 1,000 mg by mouth every 6 (six) hours as needed for moderate pain or headache.     [provider]  ciprofloxacin (CIPRO) 500 MG tablet Take 1 tablet (500 mg total) by mouth 2 (two) times daily. Patient not taking: Reported on 12/30/2016 11/29/16   Wallis BambergMani, Mario, PA-C  HYDROcodone-acetaminophen (NORCO/VICODIN) 5-325 MG tablet Take 1-2 tablets by mouth every 4 (four) hours as needed for moderate pain. Patient not taking: Reported on 12/30/2016 11/29/16   Wallis BambergMani, Mario, PA-C  ondansetron (ZOFRAN-ODT) 8 MG disintegrating tablet Take 1 tablet (8 mg total) by mouth every 8 (eight) hours as needed for nausea. Patient not taking: Reported on 12/30/2016 11/29/16   Wallis BambergMani, Mario, PA-C     Family History  Problem Relation Age of Onset  . High Cholesterol Mother      Social History   Socioeconomic History  . Marital status: Married    Spouse name: None  . Number of children: 3  . Years of education: None  . Highest education level: None  Social Needs  . Financial resource strain: None  . Food insecurity - worry: None  . Food insecurity - inability: None  . Transportation needs - medical: None  . Transportation needs - non-medical: None  Occupational History  . None  Tobacco Use  . Smoking status: Never Smoker  . Smokeless tobacco: Never Used  Substance and Sexual Activity  . Alcohol use: No  . Drug use: No  . Sexual activity: Yes    Birth control/protection: None  Other Topics Concern  . None  Social History Narrative  . None     Review of Systems: A 12 point ROS discussed and pertinent positives are indicated in the HPI above.  All other systems are negative.  Review of Systems  Vital Signs: Temp: 98.1 BP:126/87 HR: 94 RR:18   Physical Exam  Constitutional: She is oriented to person, place, and time. She appears well-developed. No distress.  HENT:  Head: Normocephalic.  Mouth/Throat: Oropharynx is clear and moist.  Neck: Normal range of motion. No JVD present. No tracheal deviation present.  Cardiovascular: Normal rate, regular rhythm and normal heart sounds.  Pulmonary/Chest: Effort normal and breath sounds normal. No respiratory distress.  Abdominal: Soft. There is no tenderness.  Neurological: She is oriented to person, place, and time.  Skin: Skin is  warm and dry.  Psychiatric: She has a normal mood and affect.    Imaging: No results found.  Labs:  CBC: Recent Labs    08/07/16 1550 08/17/16 2207 09/02/16 0922 12/30/16 1440  WBC 6.6 11.0* 4.6 5.9  HGB 11.6* 11.4* 11.3* 11.2*  HCT 35.2* 34.8* 33.9* 34.6*  PLT 262 201 244.0 181    COAGS: Recent Labs    09/10/16 1104 12/30/16 1440  INR 1.08 0.95  APTT  --  31    BMP: Recent Labs    08/17/16 2207 09/02/16 0922 09/10/16 1104  12/30/16 1440  NA 133* 136 135 141  K 3.6 3.8 3.7 3.6  CL 101 104 108 106  CO2 25 25 21* 27  GLUCOSE 151* 101* 94 107*  BUN 14 10 12 11   CALCIUM 8.9 9.2 9.0 8.9  CREATININE 0.75 0.66 0.67 0.86  GFRNONAA >60  --  >60 >60  GFRAA >60  --  >60 >60    LIVER FUNCTION TESTS: Recent Labs    08/17/16 2207 09/02/16 0922 09/10/16 1104 12/30/16 1440  BILITOT 0.5 1.0 0.4 0.4  AST 96* 316* 22 16  ALT 55* 222* 50 17  ALKPHOS 73 99 71 75  PROT 7.5 7.5 7.6 7.6  ALBUMIN 3.9 4.0 4.1 4.0    TUMOR MARKERS: No results for input(s): AFPTM, CEA, CA199, CHROMGRNA in the last 8760 hours.  Assessment and Plan: Rt staghorn calculus with hydroneprhosis For PCN placement prior to PCNL by Urology Labs ok Risks and benefits of (R)perc nephrostomy placement were discussed with the patient including, but not limited to, infection, bleeding, significant bleeding causing loss or decrease in renal function or damage to adjacent structures.   All of the patient's questions were answered, patient is agreeable to proceed.  Consent signed and in chart.   Thank you for this interesting consult.  I greatly enjoyed meeting Erica [name] and look forward to participating in their care.  A copy of this report was sent to the requesting provider on this date.  Electronically Signed: Brayton ElBRUNING, Bocephus Cali, PA-C 12/31/2016, 8:32 AM   I spent a total of 20 minutes in face to face in clinical consultation, greater than 50% of which was counseling/coordinating care for PCN placement

## 2016-12-31 NOTE — Transfer of Care (Signed)
Immediate Anesthesia Transfer of Care Note  Patient: Erica [name]  Procedure(s) Performed: NEPHROLITHOTOMY PERCUTANEOUS (Right )  Patient Location: PACU  Anesthesia Type:General  Level of Consciousness: awake, alert , oriented and patient cooperative  Airway & Oxygen Therapy: Patient Spontanous Breathing and Patient connected to face mask oxygen  Post-op Assessment: Report given to RN, Post -op Vital signs reviewed and stable and Patient moving all extremities  Post vital signs: Reviewed and stable  Last Vitals:  Vitals:   12/31/16 0736  BP: 126/87  Pulse: 94  Resp: 18  Temp: 36.7 C  SpO2: 100%    Last Pain:  Vitals:   12/31/16 0736  TempSrc: Oral         Complications: No apparent anesthesia complications

## 2016-12-31 NOTE — Op Note (Addendum)
Operative Note  Preoperative diagnosis:  1.  Right staghorn calculus (5 cm in greatest dimension)  Postoperative diagnosis: 1.  Same  Procedure(s): 1.  Right percutaneous ultrasonic lithotripsy 2.  Right antegrade JJ stent placement 3.  Right percutaneous right nephrostomy tube  Surgeon: Ellison Hughs, MD  Assistants:  None  Anesthesia:  General endotracheal  Complications:  None  EBL:  150 mL  Specimens: 1. Right staghorn stone  Drains/Catheters: 1.  18 F Foley catheter 2.  Right 6 F JJ stent 3.  Right 77 F council tipped nephrostomy tube  Intraoperative findings:  Right staghorn stone.  Nephrostogram at the conclusion of the case showed no signs of contrast extravasation beyond the right collecting system.  Indication:  Erica [name] is a 36 y.o. female with a complete right staghorn calculus (measuring 5 cm in greatest dimension) that was initially diagnosed in June of 2018 during an evaluation for abdominal pain following a tubal ligation.  She is here today for the above procedures to address her stone.  Description of procedure: The patient had a right percutaneous nephroureteral catheter placed by interventional radiology prior to the procedure described below.    After informed consent was obtained, the patient was brought to the operating room and general endotracheal anesthesia was administered.  The patient was then placed in the prone position and padded appropriately.  A spot film was obtained, confirming correct placement of her previously placed right lower pole nephroureteral catheter.   A PTFE wire was then advanced through the nephroureteral catheter and down to the bladder, confirming placement via fluoroscopy. The nephroureteral catheter was then removed over the wire. A 2 cm right flank incision was then made adjacent to the wire.  A dual lumen catheter was then advanced over the PTFE wire and into position within the proximal right ureter. A  Super Stiff wire was then placed through the second lumen of the catheter and advanced down to the bladder, confirming placement via fluoroscopy.  The dual-lumen catheter was then removed, leaving both wires in place. A 30 French balloon dilator was advanced over the Super Stiff wire and into position within the right lower pole for cutaneous access site. The percutaneous tract was then dilated up to 20 atm of pressure with no evidence of balloon waste seen on fluoroscopy. The access sheath was then advanced over the inflated balloon and its position within the right lower pole calyx. The dilating balloon was then deflated and removed, leaving the Super Stiff wire in place. The rigid nephroscope was then advanced through the access sheath and into the right renal pelvis, immediately identifying her large staghorn stone. A percutaneous ultrasonic lithotripter was then used to simultaneously fracture the stone and suction out the vast majority of her stone fragments. Full inspection of all identifiable right renal calyces were inspected and she was found to have no large residual stone fragments.  A 6 French 24 cm JJ stent was then advanced in an antegrade fashion over the Super Stiff wire with good curl position within the bladder as well as the right renal pelvis, confirming placement via fluoroscopy. The access sheath was then removed, leaving the PTFE wire in place. An 68 French council tip catheter was then advanced over the wire and into position within the right lower pole calyx. The balloon of the catheter was then inflated with 3 mL of a contrast and water solution. A nephrostogram was obtained that showed no significant extravasation of contrast beyond the right collecting system and  confirmed excellent placement of her right nephrostomy tube as well as her right JJ stent. The right nephrostomy tube was then sutured into place and placed to gravity drainage. The right flank incision was then anesthetized  with half percent Marcaine with epinephrine for a total of 20 mL. Her right flank incision was then dressed appropriately. She tolerated the procedure well and was transferred to the postanesthesia in stable condition.  Plan: Monitor the patient on the floor overnight. Keep nephrostomy tube and Foley catheter to gravity drainage. Plan to recheck a CT stone study in the morning to assess for residual stone fragments. Likely remove her right nephrostomy tube prior to discharge and remove her right JJ stent in the office in 1-2 weeks pending the results of her CT scan.

## 2016-12-31 NOTE — Procedures (Signed)
Interventional Radiology Procedure Note  Procedure: Right nephroureteral access.  Initial access to a posterior lower pole calyx was challenging but successful.  Attempts at accessing 2 different inter-pole calyces were unsuccessful.  The stones are impacted in in the interpolar region and there is overlying cortical thinning/scarring which limits ability to manipulate within the calyx.   Complications: None  Estimated Blood Loss: < 25mL  Recommendations: - To OR for PCNL  Signed,  Sterling BigHeath K. McCullough, MD

## 2016-12-31 NOTE — Interval H&P Note (Signed)
History and Physical Interval Note:  12/31/2016 11:05 AM  Sonny MastersYovani Pineda-Reyes  has presented today for surgery, with the diagnosis of RIGHT STAGHORN CALCULUS  The various methods of treatment have been discussed with the patient and family. After consideration of risks, benefits and other options for treatment, the patient has consented to  Procedure(s): NEPHROLITHOTOMY PERCUTANEOUS (Right) CYSTOSCOPY/ URETEROSCOPY, STENT PLACEMENT (Right) as a surgical intervention .  The patient's history has been reviewed, patient examined, no change in status, stable for surgery.  I have reviewed the patient's chart and labs.  Questions were answered to the patient's satisfaction.     Dorian Furnacehristopher Aaron Winter

## 2016-12-31 NOTE — Anesthesia Procedure Notes (Signed)
Procedure Name: Intubation Date/Time: 12/31/2016 11:42 AM Performed by: Victoriano Lain, CRNA Pre-anesthesia Checklist: Patient identified, Emergency Drugs available, Suction available, Patient being monitored and Timeout performed Patient Re-evaluated:Patient Re-evaluated prior to induction Oxygen Delivery Method: Circle system utilized Preoxygenation: Pre-oxygenation with 100% oxygen Induction Type: IV induction Ventilation: Mask ventilation without difficulty Laryngoscope Size: Mac and 4 Grade View: Grade I Tube type: Oral Tube size: 7.5 mm Number of attempts: 1 Airway Equipment and Method: Stylet Placement Confirmation: ETT inserted through vocal cords under direct vision,  positive ETCO2 and breath sounds checked- equal and bilateral Secured at: 21 cm Tube secured with: Tape Dental Injury: Teeth and Oropharynx as per pre-operative assessment

## 2017-01-01 ENCOUNTER — Observation Stay (HOSPITAL_COMMUNITY): Payer: Managed Care, Other (non HMO)

## 2017-01-01 ENCOUNTER — Encounter (HOSPITAL_COMMUNITY): Payer: Self-pay | Admitting: Urology

## 2017-01-01 DIAGNOSIS — N200 Calculus of kidney: Secondary | ICD-10-CM | POA: Diagnosis not present

## 2017-01-01 LAB — CBC
HCT: 28.6 % — ABNORMAL LOW (ref 36.0–46.0)
Hemoglobin: 9.4 g/dL — ABNORMAL LOW (ref 12.0–15.0)
MCH: 25.6 pg — AB (ref 26.0–34.0)
MCHC: 32.9 g/dL (ref 30.0–36.0)
MCV: 77.9 fL — AB (ref 78.0–100.0)
PLATELETS: 182 10*3/uL (ref 150–400)
RBC: 3.67 MIL/uL — AB (ref 3.87–5.11)
RDW: 12.9 % (ref 11.5–15.5)
WBC: 12.8 10*3/uL — ABNORMAL HIGH (ref 4.0–10.5)

## 2017-01-01 LAB — BASIC METABOLIC PANEL
Anion gap: 7 (ref 5–15)
BUN: 9 mg/dL (ref 6–20)
CHLORIDE: 106 mmol/L (ref 101–111)
CO2: 22 mmol/L (ref 22–32)
CREATININE: 0.66 mg/dL (ref 0.44–1.00)
Calcium: 7.6 mg/dL — ABNORMAL LOW (ref 8.9–10.3)
GFR calc Af Amer: >60 mL/min (ref >60)
GFR calc non Af Amer: >60 mL/min (ref >60)
GLUCOSE: 131 mg/dL — AB (ref 65–99)
Potassium: 3.7 mmol/L (ref 3.5–5.1)
Sodium: 135 mmol/L (ref 135–145)

## 2017-01-01 NOTE — Progress Notes (Signed)
Removed foley at 0900. Emptied yellow clear urine. Pt had 1 void. Capped nephrostomy at 0900. Tolerated well

## 2017-01-01 NOTE — Plan of Care (Signed)
Cont to mon 

## 2017-01-01 NOTE — Progress Notes (Signed)
1 Day Post-Op Subjective: No acute events overnight.  Pain controlled.  Denies nausea/vomiting.  Low grade temp last night that has responded with Tylenol.    Objective: Vital signs in last 24 hours: Temp:  [97.7 F (36.5 C)-100.6 F (38.1 C)] 100.6 F (38.1 C) (11/07 0621) Pulse Rate:  [69-101] 100 (11/07 0621) Resp:  [9-21] 18 (11/07 0621) BP: (111-134)/(64-89) 122/65 (11/07 0621) SpO2:  [98 %-100 %] 100 % (11/07 0621) Weight:  [78 kg (171 lb 15.3 oz)] 78 kg (171 lb 15.3 oz) (11/06 1733)  Intake/Output from previous day: 11/06 0701 - 11/07 0700 In: 4935 [P.O.:720; I.V.:3765; IV Piggyback:450] Out: 3375 [Urine:3175; Blood:200]  Intake/Output this shift: No intake/output data recorded.  Physical Exam:  General: Alert and oriented CV: RRR, palpable distal pulses Lungs: CTAB, equal chest rise Abdomen: Soft, NTND, no rebound or guarding Incisions: Right flank dressing is clean and intact Ext: NT, No erythema GU: Foley and right nephrostomy tube draining slightly blood tinged urine.    Lab Results: Recent Labs    12/30/16 1440 12/31/16 1706 01/01/17 0521  HGB 11.2* 10.0* 9.4*  HCT 34.6* 30.6* 28.6*   BMET Recent Labs    12/30/16 1440 01/01/17 0521  NA 141 135  K 3.6 3.7  CL 106 106  CO2 27 22  GLUCOSE 107* 131*  BUN 11 9  CREATININE 0.86 0.66  CALCIUM 8.9 7.6*     Studies/Results: Dg Abd 1 View  Result Date: 12/31/2016 CLINICAL DATA:  Intraoperative left-sided percutaneous nephrolithotomy. EXAM: ABDOMEN - 1 VIEW; DG C-ARM GT 120 MIN-NO REPORT COMPARISON:  None. FINDINGS: Fluoroscopic guidance was provided during intraoperative percutaneous nephrolithotomy 3 static fluoroscopic images demonstrate opacification of dilated collecting system. Fluoroscopy time is recorded as 2 minutes 28 seconds. IMPRESSION: Intraoperative fluoroscopic guidance. Electronically Signed   By: Ted Mcalpineobrinka  Dimitrova M.D.   On: 12/31/2016 15:40   Dg C-arm Gt 120 Min-no Report  Result  Date: 12/31/2016 Fluoroscopy was utilized by the requesting physician.  No radiographic interpretation.   Ir Ureteral Stent Right New Access W/o Sep Nephrostomy Cath  Result Date: 12/31/2016 INDICATION: 36 year old female with a very large right-sided staghorn calculus. She presents today for percutaneous nephroureteral access prior to percutaneous nephrolithotomy. EXAM: Right-sided nephroureteral stent placement. COMPARISON:  None. MEDICATIONS: 2 g Ancef; The antibiotic was administered in an appropriate time frame prior to skin puncture. ANESTHESIA/SEDATION: Fentanyl 200 mcg IV; Versed 4 mg IV Moderate Sedation Time:  59 minutes The patient was continuously monitored during the procedure by the interventional radiology nurse under my direct supervision. CONTRAST:  15 mL Isovue 350 - administered into the collecting system(s) FLUOROSCOPY TIME:  Fluoroscopy Time: 23 minutes 12 seconds (613 mGy). COMPLICATIONS: None immediate. PROCEDURE: Informed written consent was obtained from the patient after a thorough discussion of the procedural risks, benefits and alternatives. All questions were addressed. Maximal Sterile Barrier Technique was utilized including caps, mask, sterile gowns, sterile gloves, sterile drape, hand hygiene and skin antiseptic. A timeout was performed prior to the initiation of the procedure. An initial spot image was obtained demonstrating a very large staghorn calculus filling the upper, inter pole and lower pole collecting system. A suitable posterior inferior calyx was selected and the skin entry site marked. Local anesthesia was attained by infiltration with 1% lidocaine. Using fluoroscopic imaging guidance, an 18 gauge trocar needle was carefully advanced down onto the posterior inferior calyx. There was tactile feedback as the needle contact the stone. A gentle hand injection of contrast material through the needle  opacifies the renal collecting system. There is very little room within  the collecting system around the stones. The ureter is patent. A road runner hydrophilic wire was carefully advanced into the renal collecting system. When of wire purchase was attained, the 18 gauge needle was removed and a 4 French angled glide catheter was advanced into the renal collecting system. The glide catheter and wire were then manipulated until ureteral access was attained. The wire was advanced into the bladder. A 4 French glide catheter was then exchanged for a 5 [place]French angiographic catheter which was successfully advanced over the wire and into the bladder. The wire was removed. The angiographic catheter was secured to the skin surface. Attention was then turned to the interpolar calices. There are 2 interpolar calices which may be suitable for access. Review of the CT imaging demonstrates that the more lateral access has significant overlying cortical scarring. There is less significant scarring overlying the more posterior calyx. The more posterior calyx was selected first. Using the same technique described above, an 18 gauge trocar needle was brought down onto the stone within the posterior interpolar calyx. A gentle injection of contrast material successfully opacified the calyx. The was very difficult to get any contrast material to pass out of the calyx and into the renal pelvis. Attempts were made to manipulate a hydrophilic wire through the calyx and into the renal collecting system. Unfortunately, this was not successful. The wire buckled through the calyx into the interstitium rather than passing between the stone an urothelium. There was some extravasation of contrast material. The more lateral calyx was then selected and targeted. Using the same technique described above, an 18 gauge trocar needle was guided down onto the post row lateral calyx. There is tactile feedback as the needle mat the stone. Again, a hand injection of contrast does opacified the calyx surrounding the stone but no  contrast enters the renal pelvis. Similar to the more posterior inter pole calyx, a hydrophilic wire could not be successfully navigated into the renal pelvis. Further attempts at achieving an interpolar access were aborted. IMPRESSION: 1. Successful posterior lower pole calyceal access with placement of a 5 French catheter past the staghorn calculus, down the ureter and into the bladder. 2. Unsuccessful attempted access of interpolar calices. There is overlying renal cortical thinning/scarring and a stones are tightly impacted. A hydrophilic wire could not be advanced beyond the talus steal stone and into the renal pelvis. Signed, Sterling BigHeath K. McCullough, MD [facility] Electronically Signed   By: Malachy MoanHeath  McCullough M.D.   On: 12/31/2016 11:39    Assessment/Plan:  POD 1 s/p right PCNL  -d/c Foley catheter -Plug right nephrostomy tube.  Replace to gravity drainage if she starts having flank pain and/or nausea. -CTSS pending -Advance diet -OOBTC and ambulate -Continue Ancef.  Will monitor fever. -Likely home today    LOS: 1 day   Rhoderick Moodyhristopher Winter, MD 01/01/2017, 8:15 AM  [facility] Pager: [phone]

## 2017-01-01 NOTE — Care Management Note (Signed)
Case Management Note  Patient Details  Name: Erica FiddlerYovani [name] MRN: [id number] Date of Birth: 01/29/1981  Subjective/Objective: 36 y/o f admitted w/Staghorn renal calculus. POD#1 R PCN. From home.                   Action/Plan:d/c plan home.   Expected Discharge Date:                  Expected Discharge Plan:  Home/Self Care  In-House Referral:     Discharge planning Services  CM Consult  Post Acute Care Choice:    Choice offered to:     DME Arranged:    DME Agency:     HH Arranged:    HH Agency:     Status of Service:  In process, will continue to follow  If discussed at Long Length of Stay Meetings, dates discussed:    Additional Comments:  Lanier ClamMahabir, Merlin Ege, RN 01/01/2017, 12:07 PM

## 2017-01-02 ENCOUNTER — Other Ambulatory Visit: Payer: Self-pay | Admitting: Urology

## 2017-01-03 ENCOUNTER — Other Ambulatory Visit: Payer: Self-pay | Admitting: Urology

## 2017-01-03 NOTE — Discharge Summary (Signed)
Date of admission: 12/31/2016  Date of discharge: 01/01/2017  Admission diagnosis: Right staghorn stone  Discharge diagnosis: Same  Procedures: Right PCNL   History and Physical: For full details, please see admission history and physical. Briefly, Erica [name] is a 36 y.o. year old patient with a right staghorn stone requiring a right PCNL.   Hospital Course: The patient was monitored on the floor overnight.  CTSS on 01/01/17 showed a large amount of residual stone and migration of her right JJ stent.  Her nephrostomy tube was removed.  She was discharged home with plans to have a repeat ureteroscopy for the residual right sided stones in one week.  Laboratory values:  Recent Labs    12/31/16 1706 01/01/17 0521  HGB 10.0* 9.4*  HCT 30.6* 28.6*   Recent Labs    01/01/17 0521  CREATININE 0.66    Disposition: Home  Discharge instruction: The patient was instructed to be ambulatory but told to refrain from heavy lifting, strenuous activity, or driving.   Discharge medications:  Allergies as of 01/01/2017   No Known Allergies     Medication List    TAKE these medications   acetaminophen 500 MG tablet Commonly known as:  TYLENOL Take 1,000 mg by mouth every 6 (six) hours as needed for moderate pain or headache.   ciprofloxacin 500 MG tablet Commonly known as:  CIPRO Take 1 tablet (500 mg total) 2 (two) times daily for 3 days by mouth.   HYDROcodone-acetaminophen 5-325 MG tablet Commonly known as:  NORCO/VICODIN Take 1-2 tablets every 4 (four) hours as needed by mouth for moderate pain.   ondansetron 8 MG disintegrating tablet Commonly known as:  ZOFRAN-ODT Take 1 tablet (8 mg total) every 8 (eight) hours as needed by mouth for nausea.       Followup:  Follow-up Information    Rene PaciWinter, George Haggart Aaron, MD In 1 week.   Specialty:  Urology Contact information: [address] [place] [address] [phone]

## 2017-01-07 ENCOUNTER — Encounter (HOSPITAL_BASED_OUTPATIENT_CLINIC_OR_DEPARTMENT_OTHER): Payer: Self-pay | Admitting: *Deleted

## 2017-01-07 ENCOUNTER — Other Ambulatory Visit: Payer: Self-pay | Admitting: *Deleted

## 2017-01-07 NOTE — Patient Outreach (Signed)
[facility]) Care Management  01/07/2017  Erica FiddlerYovani [name] 05/20/1980 [id number]   Subjective: Telephone call to patient's home  / mobile number, no answer, left HIPAA compliant voicemail message, and requested call back.     Objective: Per KPN (Knowledge Performance Now, point of care tool), [employer] iCollaborate, and chart review, patient hospitalized  12/31/16 -01/01/17 for Right staghorn stone.    Status post Right percutaneous ultrasonic lithotripsy, Right antegrade JJ stent placement, and Right percutaneous right nephrostomy tube on 12/31/16.       Assessment: [employer] Transition of care referral on 01/07/17.   Transition of care follow up pending patient contact.      Plan:  RNCM will call patient for 2nd telephone outreach attempt, transition of care follow up, within 10 business days if no return call.     Yuji Walth H. Gardiner Barefootooper RN, BSN, CCM [facility] Care Management [facility] Telephonic CM Phone: [phone] Fax: [phone]

## 2017-01-09 ENCOUNTER — Encounter (HOSPITAL_BASED_OUTPATIENT_CLINIC_OR_DEPARTMENT_OTHER): Payer: Self-pay | Admitting: *Deleted

## 2017-01-09 ENCOUNTER — Other Ambulatory Visit: Payer: Self-pay | Admitting: *Deleted

## 2017-01-09 ENCOUNTER — Other Ambulatory Visit: Payer: Self-pay

## 2017-01-09 ENCOUNTER — Ambulatory Visit: Payer: Self-pay | Admitting: *Deleted

## 2017-01-09 NOTE — Patient Outreach (Signed)
[facility]) Care Management  01/09/2017  Erica [name] 12/15/1980 [id number]   Subjective: Telephone call to patient's home  / mobile number, no answer, left HIPAA compliant voicemail message, and requested call back.     Objective: Per KPN (Knowledge Performance Now, point of care tool), [employer] iCollaborate, and chart review, patient hospitalized  12/31/16 -01/01/17 for Right staghorn stone.    Status post Right percutaneous ultrasonic lithotripsy, Right antegrade JJ stent placement, and Right percutaneous right nephrostomy tube on 12/31/16.       Assessment: [employer] Transition of care referral on 01/07/17.   Transition of care follow up pending patient contact.      Plan:  RNCM will call patient for 3rd telephone outreach attempt, transition of care follow up, within 10 business days if no return call.     [name] H. Gardiner Barefootooper RN, BSN, CCM [facility] Care [facility] Telephonic CM Phone: [phone] Fax: [phone]

## 2017-01-09 NOTE — H&P (Signed)
Urology Preoperative H&P   Chief Complaint: Right kidney stones  History of Present Illness: Erica [name] is a 36 y.o. female with a large amount of residual right kidney stones following right PCNL on 12/31/2016.  Since surgery, she has had continuous leakage from her right flank incision more her nephrostomy tube entered.  In the interim, she denies fever/chills dysuria or hematuria.  She is here today for definitive stone treatment.   Past Medical History:  Diagnosis Date  . Anemia   . History of kidney stones   . Hypertension    [facility]CP but patient did not follow up, no meds    Past Surgical History:  Procedure Laterality Date  . CHOLECYSTECTOMY N/A 09/10/2016   Procedure: LAPAROSCOPIC CHOLECYSTECTOMY WITH INTRAOPERATIVE CHOLANGIOGRAM;  Surgeon: Darnell LevelGerkin, Todd, MD;  Location: [facility] OR;  Service: General;  Laterality: N/A;  . IR URETERAL STENT RIGHT NEW ACCESS W/O SEP NEPHROSTOMY CATH  12/31/2016  . LAPAROSCOPIC BILATERAL SALPINGECTOMY Bilateral 08/12/2016   Procedure: LAPAROSCOPIC BILATERAL SALPINGECTOMY;  Surgeon: Reva BoresPratt, Tanya S, MD;  Location: [facility] ORS;  Service: Gynecology;  Laterality: Bilateral;  . NEPHROLITHOTOMY Right 12/31/2016   Procedure: NEPHROLITHOTOMY PERCUTANEOUS;  Surgeon: Rene PaciWinter, Chaden Doom Aaron, MD;  Location: [facility] ORS;  Service: Urology;  Laterality: Right;  . TUBAL LIGATION      Allergies: No Known Allergies  Family History  Problem Relation Age of Onset  . High Cholesterol Mother     Social History:  reports that  has never smoked. she has never used smokeless tobacco. She reports that she does not drink alcohol or use drugs.  ROS: A complete review of systems was performed.  All systems are negative except for pertinent findings as noted.  Physical Exam:  Vital signs in last 24 hours:   Constitutional:  Alert and oriented, No acute distress Cardiovascular: Regular rate and rhythm, No JVD Respiratory: Normal respiratory  effort, Lungs clear bilaterally GI: Abdomen is soft, nontender, nondistended, no abdominal masses GU: No CVA tenderness Lymphatic: No lymphadenopathy Neurologic: Grossly intact, no focal deficits Psychiatric: Normal mood and affect  Laboratory Data:  No results for input(s): WBC, HGB, HCT, PLT in the last 72 hours.  No results for input(s): NA, K, CL, GLUCOSE, BUN, CALCIUM, CREATININE in the last 72 hours.  Invalid input(s): CO3   No results found for this or any previous visit (from the past 24 hour(s)). No results found for this or any previous visit (from the past 240 hour(s)).  Renal Function: No results for input(s): CREATININE in the last 168 hours. Estimated Creatinine Clearance: 93.2 mL/min (by C-G formula based on SCr of 0.66 mg/dL).  Radiologic Imaging: No results found.  I independently reviewed the above imaging studies.  Assessment and Plan Erica [name] is a 36 y.o. female with a large right renal stone burden following PCNL on 12/31/2016  -The risks, benefits and alternatives of cystoscopy with right ureteroscopy, holmium laser lithotripsy and right JJ stent placement was discussed with the patient.  She voices understanding and wishes to proceed.   Rhoderick Moodyhristopher Benjiman Sedgwick, MD 01/09/2017, 5:04 PM  [facility] Pager: [phone]

## 2017-01-09 NOTE — Progress Notes (Signed)
Npo after mdnight food, clear liquids from midnight until 900 am then npo, may take  hydrocodone prn, will arrange driver over 37 years of age, cbc, bmet 01-01-17, ekg 09-10-16 all results in epic and on chart, serum preg 12-30-16 (has had tubal ligation).

## 2017-01-10 ENCOUNTER — Encounter (HOSPITAL_BASED_OUTPATIENT_CLINIC_OR_DEPARTMENT_OTHER)
Admission: RE | Disposition: A | Discharge status: Home / Self Care | Payer: Self-pay | Source: Ambulatory Visit | Attending: Urology

## 2017-01-10 ENCOUNTER — Encounter (HOSPITAL_BASED_OUTPATIENT_CLINIC_OR_DEPARTMENT_OTHER): Payer: Self-pay | Admitting: Anesthesiology

## 2017-01-10 ENCOUNTER — Ambulatory Visit (HOSPITAL_BASED_OUTPATIENT_CLINIC_OR_DEPARTMENT_OTHER): Payer: Managed Care, Other (non HMO) | Admitting: Anesthesiology

## 2017-01-10 ENCOUNTER — Ambulatory Visit: Payer: Self-pay | Admitting: *Deleted

## 2017-01-10 ENCOUNTER — Other Ambulatory Visit: Payer: Self-pay | Admitting: *Deleted

## 2017-01-10 ENCOUNTER — Encounter: Payer: Self-pay | Admitting: *Deleted

## 2017-01-10 ENCOUNTER — Ambulatory Visit (HOSPITAL_BASED_OUTPATIENT_CLINIC_OR_DEPARTMENT_OTHER)
Admission: RE | Admit: 2017-01-10 | Discharge: 2017-01-10 | Disposition: A | Discharge status: Home / Self Care | Payer: Managed Care, Other (non HMO) | Source: Ambulatory Visit | Attending: Urology | Admitting: Urology

## 2017-01-10 DIAGNOSIS — Z87442 Personal history of urinary calculi: Secondary | ICD-10-CM | POA: Diagnosis not present

## 2017-01-10 DIAGNOSIS — Y831 Surgical operation with implant of artificial internal device as the cause of abnormal reaction of the patient, or of later complication, without mention of misadventure at the time of the procedure: Secondary | ICD-10-CM | POA: Diagnosis not present

## 2017-01-10 DIAGNOSIS — N200 Calculus of kidney: Secondary | ICD-10-CM | POA: Insufficient documentation

## 2017-01-10 DIAGNOSIS — T83123A Displacement of other urinary stents, initial encounter: Secondary | ICD-10-CM | POA: Diagnosis not present

## 2017-01-10 HISTORY — PX: CYSTOSCOPY/URETEROSCOPY/HOLMIUM LASER/STENT PLACEMENT: SHX6546

## 2017-01-10 HISTORY — PX: HOLMIUM LASER APPLICATION: SHX5852

## 2017-01-10 SURGERY — CYSTOSCOPY/URETEROSCOPY/HOLMIUM LASER/STENT PLACEMENT
Anesthesia: General | Site: Renal | Laterality: Right

## 2017-01-10 MED ORDER — DEXAMETHASONE SODIUM PHOSPHATE 10 MG/ML IJ SOLN
INTRAMUSCULAR | Status: AC
  Filled 2017-01-10: qty 1

## 2017-01-10 MED ORDER — HYDROCODONE-ACETAMINOPHEN 5-325 MG PO TABS: 1.0000 | ORAL_TABLET | ORAL | 0 refills | Status: DC | PRN

## 2017-01-10 MED ORDER — KETOROLAC TROMETHAMINE 30 MG/ML IJ SOLN
INTRAMUSCULAR | Status: DC | PRN
  Administered 2017-01-10: 30 mg via INTRAVENOUS

## 2017-01-10 MED ORDER — ONDANSETRON HCL 4 MG/2ML IJ SOLN
INTRAMUSCULAR | Status: DC | PRN
  Administered 2017-01-10: 4 mg via INTRAVENOUS

## 2017-01-10 MED ORDER — MIDAZOLAM HCL 2 MG/2ML IJ SOLN
INTRAMUSCULAR | Status: AC
  Filled 2017-01-10: qty 2

## 2017-01-10 MED ORDER — FENTANYL CITRATE (PF) 100 MCG/2ML IJ SOLN
INTRAMUSCULAR | Status: AC
  Filled 2017-01-10: qty 2

## 2017-01-10 MED ORDER — PROPOFOL 10 MG/ML IV BOLUS
INTRAVENOUS | Status: AC
  Filled 2017-01-10: qty 20

## 2017-01-10 MED ORDER — PROMETHAZINE HCL 25 MG/ML IJ SOLN
6.2500 mg | INTRAMUSCULAR | Status: DC | PRN
Start: 2017-01-10 — End: 2017-01-10
  Filled 2017-01-10: qty 1

## 2017-01-10 MED ORDER — FENTANYL CITRATE (PF) 100 MCG/2ML IJ SOLN
INTRAMUSCULAR | Status: DC | PRN
  Administered 2017-01-10 (×12): 25 ug via INTRAVENOUS

## 2017-01-10 MED ORDER — KETOROLAC TROMETHAMINE 30 MG/ML IJ SOLN
INTRAMUSCULAR | Status: AC
  Filled 2017-01-10: qty 1

## 2017-01-10 MED ORDER — SODIUM CHLORIDE 0.9 % IR SOLN
Status: DC | PRN
  Administered 2017-01-10: 7000 mL

## 2017-01-10 MED ORDER — ONDANSETRON 8 MG PO TBDP: 8.0000 mg | ORAL_TABLET | Freq: Three times a day (TID) | ORAL | 0 refills | Status: DC | PRN

## 2017-01-10 MED ORDER — DEXAMETHASONE SODIUM PHOSPHATE 4 MG/ML IJ SOLN
INTRAMUSCULAR | Status: DC | PRN
  Administered 2017-01-10: 10 mg via INTRAVENOUS

## 2017-01-10 MED ORDER — CIPROFLOXACIN IN D5W 400 MG/200ML IV SOLN
INTRAVENOUS | Status: AC
  Filled 2017-01-10: qty 200

## 2017-01-10 MED ORDER — LIDOCAINE 2% (20 MG/ML) 5 ML SYRINGE
INTRAMUSCULAR | Status: DC | PRN
  Administered 2017-01-10: 80 mg via INTRAVENOUS

## 2017-01-10 MED ORDER — MIDAZOLAM HCL 5 MG/5ML IJ SOLN
INTRAMUSCULAR | Status: DC | PRN
  Administered 2017-01-10: 2 mg via INTRAVENOUS

## 2017-01-10 MED ORDER — LIDOCAINE 2% (20 MG/ML) 5 ML SYRINGE
INTRAMUSCULAR | Status: AC
  Filled 2017-01-10: qty 5

## 2017-01-10 MED ORDER — LACTATED RINGERS IV SOLN
INTRAVENOUS | Status: DC
  Administered 2017-01-10 (×2): via INTRAVENOUS
  Filled 2017-01-10: qty 1000

## 2017-01-10 MED ORDER — IOHEXOL 300 MG/ML  SOLN
INTRAMUSCULAR | Status: DC | PRN
  Administered 2017-01-10: 10 mL

## 2017-01-10 MED ORDER — FENTANYL CITRATE (PF) 100 MCG/2ML IJ SOLN
25.0000 ug | INTRAMUSCULAR | Status: DC | PRN
Start: 2017-01-10 — End: 2017-01-10
  Filled 2017-01-10: qty 1

## 2017-01-10 MED ORDER — ONDANSETRON HCL 4 MG/2ML IJ SOLN
INTRAMUSCULAR | Status: AC
  Filled 2017-01-10: qty 2

## 2017-01-10 MED ORDER — PROPOFOL 10 MG/ML IV BOLUS
INTRAVENOUS | Status: DC | PRN
  Administered 2017-01-10: 200 mg via INTRAVENOUS

## 2017-01-10 MED ORDER — SCOPOLAMINE 1 MG/3DAYS TD PT72
1.0000 | MEDICATED_PATCH | Freq: Once | TRANSDERMAL | Status: DC
  Filled 2017-01-10: qty 1

## 2017-01-10 MED ORDER — CIPROFLOXACIN HCL 500 MG PO TABS: 500.0000 mg | ORAL_TABLET | Freq: Two times a day (BID) | ORAL | 0 refills | Status: AC

## 2017-01-10 MED ORDER — CIPROFLOXACIN IN D5W 400 MG/200ML IV SOLN
400.0000 mg | Freq: Once | INTRAVENOUS | Status: AC
  Administered 2017-01-10: 400 mg via INTRAVENOUS
  Filled 2017-01-10: qty 200

## 2017-01-10 SURGICAL SUPPLY — 38 items
APL SKNCLS STERI-STRIP NONHPOA (GAUZE/BANDAGES/DRESSINGS)
BAG DRAIN URO-CYSTO SKYTR STRL (DRAIN) ×3 IMPLANT
BAG DRN UROCATH (DRAIN) ×1
BAG URINE DRAINAGE (UROLOGICAL SUPPLIES) ×2 IMPLANT
BASKET STONE 1.7 NGAGE (UROLOGICAL SUPPLIES) IMPLANT
BASKET ZERO TIP NITINOL 2.4FR (BASKET) ×3 IMPLANT
BENZOIN TINCTURE PRP APPL 2/3 (GAUZE/BANDAGES/DRESSINGS) IMPLANT
BSKT STON RTRVL ZERO TP 2.4FR (BASKET) ×1
CATH FOLEY 2WAY SLVR  5CC 16FR (CATHETERS) ×2
CATH FOLEY 2WAY SLVR 5CC 16FR (CATHETERS) IMPLANT
CATH INTERMIT  6FR 70CM (CATHETERS) ×2 IMPLANT
CLOSURE WOUND 1/2 X4 (GAUZE/BANDAGES/DRESSINGS)
CLOTH BEACON ORANGE TIMEOUT ST (SAFETY) ×3 IMPLANT
FIBER LASER FLEXIVA 365 (UROLOGICAL SUPPLIES) IMPLANT
FIBER LASER TRAC TIP (UROLOGICAL SUPPLIES) ×4 IMPLANT
GLOVE BIO SURGEON STRL SZ7.5 (GLOVE) ×3 IMPLANT
GLOVE BIOGEL PI IND STRL 7.5 (GLOVE) IMPLANT
GLOVE BIOGEL PI INDICATOR 7.5 (GLOVE) ×4
GOWN STRL REUS W/TWL XL LVL3 (GOWN DISPOSABLE) ×6 IMPLANT
GUIDEWIRE ANG ZIPWIRE 038X150 (WIRE) ×3 IMPLANT
GUIDEWIRE STR DUAL SENSOR (WIRE) IMPLANT
HOLDER FOLEY CATH W/STRAP (MISCELLANEOUS) ×2 IMPLANT
INFUSOR MANOMETER BAG 3000ML (MISCELLANEOUS) ×5 IMPLANT
IV NS 1000ML (IV SOLUTION) ×3
IV NS 1000ML BAXH (IV SOLUTION) IMPLANT
IV NS IRRIG 3000ML ARTHROMATIC (IV SOLUTION) ×5 IMPLANT
KIT RM TURNOVER CYSTO AR (KITS) ×3 IMPLANT
MANIFOLD NEPTUNE II (INSTRUMENTS) ×3 IMPLANT
NS IRRIG 500ML POUR BTL (IV SOLUTION) ×4 IMPLANT
PACK CYSTO (CUSTOM PROCEDURE TRAY) ×3 IMPLANT
RETRIEVER STENT NSNARE (INSTRUMENTS) ×2 IMPLANT
SHEATH ACCESS URETERAL 54CM (SHEATH) ×2 IMPLANT
SHEATH URETERAL 12FRX35CM (MISCELLANEOUS) ×2 IMPLANT
STENT URET 6FRX26 CONTOUR (STENTS) ×2 IMPLANT
STRIP CLOSURE SKIN 1/2X4 (GAUZE/BANDAGES/DRESSINGS) IMPLANT
SYRINGE 10CC LL (SYRINGE) ×3 IMPLANT
TUBE CONNECTING 12'X1/4 (SUCTIONS)
TUBE CONNECTING 12X1/4 (SUCTIONS) IMPLANT

## 2017-01-10 NOTE — Op Note (Signed)
Operative Note  Preoperative diagnosis:  1.  Right Staghorn stone measuring 3 cm in greatest dimension  Postoperative diagnosis: 1.  Right Staghorn stone measuring 3 cm in greatest dimension Procedure(s): 1.  Cystoscopy 2.  Right JJ stent exchange 3.  Right ureteroscopy 4.  Laser lithotripsy 5.  Right JJ stent placement  Surgeon: Rhoderick Moodyhristopher Jarryd Gratz, MD  Assistants:  None  Anesthesia:  Gen. LMA  Complications:  None  EBL:  Less than 5 ML  Specimens: 1. Previously placed right JJ stent  Drains/Catheters: 1.  Right 6 French JJ stent without tether 2. 16 French Foley catheter  Intraoperative findings:  1.  Migrated right JJ stent 2.  Right staghorn stone  Indication:  Erica [name] is a 36 y.o. female with a large amount of residual right kidney stones following right PCNL on 12/31/2016.  Since surgery, she has had continuous leakage from her right flank incision more her nephrostomy tube entered.  In the interim, she denies fever/chills dysuria or hematuria.  She is here today for definitive stone treatment.   Description of procedure:  After informed consent was obtained, the patient was brought to the operating room and general LMA anesthesia was administered. The patient was then placed in the dorsolithotomy position and prepped and draped in usual sterile fashion. A timeout was performed. A 21 French rigid cystoscope was then inserted into the urethral meatus and advanced into the bladder under direct vision. A complete bladder survey revealed no intravesical pathology.   Her right JJ stent had migrated proximally following antegrade placement during her initial PCNL. A Glidewire was then placed into the right ureteral orifice and advanced up the right renal pelvis, under fluoroscopic guidance. A semirigid ureteroscope was then advanced alongside the wire until the stone was identified. A basket was then used to grasp the controlled end of the stent and removed it  intact.  The semirigid ureteroscope was then removed. A ureteral access sheath was then placed over the wire and into good position within the right proximal ureter. A flexible ureteroscope was then advanced through the lumen of the access sheath and up to the right renal pelvis. Multiple large stones were identified with the greatest measuring approximately 3 cm in greatest dimension. A 200  holmium laser was then used to dust the remaining stones. The flexible ureteroscope and access sheath were then removed under direct vision, revealing no evidence of ureteral trauma.  A 6 [place] JJ stent was then placed over the wire and into good position within the right collecting system, confirming placement via fluoroscopy. There is good curl in the right renal pelvis as well as the bladder, confirmed by fluoroscopy and cystoscopy. A 16 French Foley catheter was then placed in the bladder was drained. She tolerated the procedure well and was transferred to the postanesthesia unit in stable condition.  Plan: Follow-up in 2 weeks for KUB in the office and possible stent removal. She has been instructed to remove her Foley catheter at 6 AM on 01/13/2017.

## 2017-01-10 NOTE — Discharge Instructions (Signed)
[facility] [phone] Post Ureteroscopy With or Without Stent Instructions  Definitions:  Ureter: The duct that transports urine from the kidney to the bladder. Stent:   A plastic hollow tube that is placed into the ureter, from the kidney to the  bladder to prevent the ureter from swelling shut.  GENERAL INSTRUCTIONS:  Despite the fact that no skin incisions were used, the area around the ureter and bladder is raw and irritated. The stent is a foreign body which will further irritate the bladder wall. This irritation is manifested by increased frequency of urination, both day and night, and by an increase in the urge to urinate. In some, the urge to urinate is present almost always. Sometimes the urge is strong enough that you may not be able to stop yourself from urinating. The only real cure is to remove the stent and then give time for the bladder wall to heal which can't be done until the danger of the ureter swelling shut has passed, which varies.  You may see some blood in your urine while the stent is in place and a few days afterwards. Do not be alarmed, even if the urine was clear for a while. Get off your feet and drink lots of fluids until clearing occurs. If you start to pass clots or don't improve, call us.  DIET: You may return to your normal diet immediately. Because of the raw surface of your bladder, alcohol, spicy foods, acid type foods and drinks with caffeine may cause irritation or frequency and should be used in moderation. To keep your urine flowing freely and to avoid constipation, drink plenty of fluids during the day ( 8-10 glasses ). Tip: Avoid cranberry juice because it is very acidic.  ACTIVITY: Your physical activity doesn't need to be restricted. However, if you are very active, you may see some blood in your urine. We suggest that you reduce your activity under these circumstances until the bleeding has stopped.  BOWELS: It is important to  keep your bowels regular during the postoperative period. Straining with bowel movements can cause bleeding. A bowel movement every other day is reasonable. Use a mild laxative if needed, such as Milk of Magnesia 2-3 tablespoons, or 2 Dulcolax tablets. Call if you continue to have problems. If you have been taking narcotics for pain, before, during or after your surgery, you may be constipated. Take a laxative if necessary.   MEDICATION: You should resume your pre-surgery medications unless told not to. In addition you will often be given an antibiotic to prevent infection. These should be taken as prescribed until the bottles are finished unless you are having an unusual reaction to one of the drugs.  PROBLEMS YOU SHOULD REPORT TO US:  Fevers over 100.5 Fahrenheit.  Heavy bleeding, or clots ( See above notes about blood in urine ).  Inability to urinate.  Drug reactions ( hives, rash, nausea, vomiting, diarrhea ).  Severe burning or pain with urination that is not improving.  FOLLOW-UP: You will need a follow-up appointment to monitor your progress. Call for this appointment at the number listed above. Usually the first appointment will be about three to fourteen days after your surgery.     Remove Foley Catheter at 6AM on 01/13/17  Post Anesthesia Home Care Instructions  Activity: Get plenty of rest for the remainder of the day. A responsible individual must stay with you for 24 hours following the procedure.  For the next 24 hours, DO NOT: -Drive  a car -Advertising copywriterperate machinery -Drink alcoholic beverages -Take any medication unless instructed by your physician -Make any legal decisions or sign important papers.  Meals: Start with liquid foods such as gelatin or soup. Progress to regular foods as tolerated. Avoid greasy, spicy, heavy foods. If nausea and/or vomiting occur, drink only clear liquids until the nausea and/or vomiting subsides. Call your physician if vomiting  continues.  Special Instructions/Symptoms: Your throat may feel dry or sore from the anesthesia or the breathing tube placed in your throat during surgery. If this causes discomfort, gargle with warm salt water. The discomfort should disappear within 24 hours.  If you had a scopolamine patch placed behind your ear for the management of post- operative nausea and/or vomiting:  1. The medication in the patch is effective for 72 hours, after which it should be removed.  Wrap patch in a tissue and discard in the trash. Wash hands thoroughly with soap and water. 2. You may remove the patch earlier than 72 hours if you experience unpleasant side effects which may include dry mouth, dizziness or visual disturbances. 3. Avoid touching the patch. Wash your hands with soap and water after contact with the patch.

## 2017-01-10 NOTE — Anesthesia Procedure Notes (Signed)
Procedure Name: LMA Insertion Date/Time: 01/10/2017 2:41 PM Performed by: Jessica PriestBeeson, Kaleo Condrey C, CRNA Pre-anesthesia Checklist: Patient identified, Emergency Drugs available, Suction available and Patient being monitored Patient Re-evaluated:Patient Re-evaluated prior to induction Oxygen Delivery Method: Circle system utilized Preoxygenation: Pre-oxygenation with 100% oxygen Induction Type: IV induction Ventilation: Mask ventilation without difficulty LMA: LMA inserted LMA Size: 4.0 Number of attempts: 1 Airway Equipment and Method: Bite block Placement Confirmation: positive ETCO2 Tube secured with: Tape Dental Injury: Teeth and Oropharynx as per pre-operative assessment

## 2017-01-10 NOTE — Patient Outreach (Signed)
[facility] Care Management  01/10/2017  Erica FiddlerYovani [name] 12/21/1980 [id number]   Subjective:Telephone call to patient's home / mobile number, no answer, left HIPAA compliant voicemail message, and requested call back.     Objective:Per KPN (Knowledge Performance Now, point of care tool), [employer] iCollaborate, and chart review, patient hospitalized11/6/18 -01/01/17 forRight staghorn stone. Status post Right percutaneous ultrasonic lithotripsy,Right antegrade JJ stent placement, andRight percutaneous right nephrostomy tubeon 12/31/16.     Assessment: [employer] Transition of care referral on 01/07/17.Transition of care follow up pending patient contact.     Plan:RNCM will send unsuccessful outreach  letter, [facility] pamph[facility], and proceed with case closure, within 10 business days if no return call.     Gorge Almanza H. Gardiner Barefootooper RN, BSN, CCM [facility] Care [facility] Telephonic CM Phone: [phone] Fax: [phone]

## 2017-01-10 NOTE — Interval H&P Note (Signed)
History and Physical Interval Note:  01/10/2017 1:42 PM  Erica [name]  has presented today for surgery, with the diagnosis of RIGHT RENAL STONE  The various methods of treatment have been discussed with the patient and family. After consideration of risks, benefits and other options for treatment, the patient has consented to  Procedure(s): CYSTOSCOPY/URETEROSCOPY/HOLMIUM LASER/STENT PLACEMENT (Right) as a surgical intervention .  The patient's history has been reviewed, patient examined, no change in status, stable for surgery.  I have reviewed the patient's chart and labs.  Questions were answered to the patient's satisfaction.     Dorian Furnacehristopher Aaron Winter

## 2017-01-10 NOTE — Transfer of Care (Signed)
Immediate Anesthesia Transfer of Care Note  Patient: Erica [name]  Procedure(s) Performed: Procedure(s) (LRB): CYSTOSCOPY/URETEROSCOPY /HOLMIUM LASER/STENT REPLACEMENT (Right) HOLMIUM LASER APPLICATION (Right)  Patient Location: PACU  Anesthesia Type: General  Level of Consciousness: awake, sedated, patient cooperative and responds to stimulation  Airway & Oxygen Therapy: Patient Spontanous Breathing and Patient connected to [place] oxygen  Post-op Assessment: Report given to PACU RN, Post -op Vital signs reviewed and stable and Patient moving all extremities  Post vital signs: Reviewed and stable  Complications: No apparent anesthesia complications

## 2017-01-10 NOTE — Anesthesia Postprocedure Evaluation (Signed)
Anesthesia Post Note  Patient: Erica [name]  Procedure(s) Performed: CYSTOSCOPY/URETEROSCOPY /HOLMIUM LASER/STENT REPLACEMENT (Right Renal) HOLMIUM LASER APPLICATION (Right Renal)     Patient location during evaluation: PACU Anesthesia Type: General Level of consciousness: awake and alert Pain management: pain level controlled Vital Signs Assessment: post-procedure vital signs reviewed and stable Respiratory status: spontaneous breathing, nonlabored ventilation, respiratory function stable and patient connected to nasal cannula oxygen Cardiovascular status: blood pressure returned to baseline and stable Postop Assessment: no apparent nausea or vomiting Anesthetic complications: no    Last Vitals:  Vitals:   01/10/17 1649 01/10/17 1700  BP:  132/82  Pulse: 83 87  Resp: 13 14  Temp:    SpO2: 99% 99%    Last Pain:  Vitals:   01/10/17 1316  TempSrc:   PainSc: 2                  Keyle Doby DAVID

## 2017-01-10 NOTE — Anesthesia Preprocedure Evaluation (Signed)
Anesthesia Evaluation  Patient identified by MRN, date of birth, ID band Patient awake    Reviewed: Allergy & Precautions, NPO status , Patient's Chart, lab work & pertinent test results  Airway Mallampati: II  TM Distance: >3 FB Neck ROM: Full    Dental  (+) Teeth Intact, Dental Advisory Given   Pulmonary neg pulmonary ROS,    Pulmonary exam normal breath sounds clear to auscultation       Cardiovascular hypertension, Normal cardiovascular exam Rhythm:Regular Rate:Normal     Neuro/Psych negative neurological ROS  negative psych ROS   GI/Hepatic negative GI ROS, Neg liver ROS,   Endo/Other  negative endocrine ROS  Renal/GU Renal disease (Nephrolithiasis)     Musculoskeletal negative musculoskeletal ROS (+)   Abdominal   Peds  Hematology  (+) Blood dyscrasia, anemia ,   Anesthesia Other Findings Day of surgery medications reviewed with the patient.  Reproductive/Obstetrics negative OB ROS                             Anesthesia Physical Anesthesia Plan  ASA: II  Anesthesia Plan: General   Post-op Pain Management:    Induction: Intravenous  PONV Risk Score and Plan: 4 or greater and Midazolam, Dexamethasone, Ondansetron and Scopolamine patch - Pre-op  Airway Management Planned: LMA  Additional Equipment:   Intra-op Plan:   Post-operative Plan: Extubation in OR  Informed Consent: I have reviewed the patients History and Physical, chart, labs and discussed the procedure including the risks, benefits and alternatives for the proposed anesthesia with the patient or authorized representative who has indicated his/her understanding and acceptance.   Dental advisory given  Plan Discussed with: CRNA  Anesthesia Plan Comments: (Risks/benefits of general anesthesia discussed with patient including risk of damage to teeth, lips, gum, and tongue, nausea/vomiting, allergic reactions to  medications, and the possibility of heart attack, stroke and death.  All patient questions answered.  Patient wishes to proceed.)        Anesthesia Quick Evaluation

## 2017-01-13 ENCOUNTER — Encounter (HOSPITAL_BASED_OUTPATIENT_CLINIC_OR_DEPARTMENT_OTHER): Payer: Self-pay | Admitting: Urology

## 2017-01-24 ENCOUNTER — Other Ambulatory Visit: Payer: Self-pay | Admitting: *Deleted

## 2017-01-24 NOTE — Patient Outreach (Signed)
[facility] Care Management  01/24/2017  Erica [name] 01/13/1981 [id number]   No response from patient outreach attempts will proceed with case closure.    Objective:Per KPN (Knowledge Performance Now, point of care tool), [employer] iCollaborate, and chart review, patient hospitalized11/6/18 -01/01/17 forRight staghorn stone. Status post Right percutaneous ultrasonic lithotripsy,Right antegrade JJ stent placement, andRight percutaneous right nephrostomy tubeon 12/31/16.     Assessment: [employer] Transition of care referral on 01/07/17.Transition of care follow up not completed due to unable to contact patient and will proceed with case closure.      Plan:RNCM will send case closure due to unable to reach request to Iverson AlaminLaura Greeson [facility].     Rolene Andrades H. Gardiner Barefootooper RN, BSN, CCM [facility] Telephonic CM Phone: [phone] Fax: [phone]

## 2017-01-29 ENCOUNTER — Other Ambulatory Visit: Payer: Self-pay | Admitting: Urology

## 2017-01-30 ENCOUNTER — Encounter (HOSPITAL_BASED_OUTPATIENT_CLINIC_OR_DEPARTMENT_OTHER): Payer: Self-pay | Admitting: *Deleted

## 2017-01-30 ENCOUNTER — Other Ambulatory Visit: Payer: Self-pay

## 2017-01-30 NOTE — Progress Notes (Signed)
SPOKE W/ PT VIA PHONE FOR PRE-OP INTERVIEW.  NPO AFTER MN W/ EXCEPTION CLEAR LIQUIDS UNTIL 0700 (NO CREAM/ MILK PRODUCTS).  ARRIVE AT 1100.  CURRENT LAB RESULTS IN CHART AND Epic.

## 2017-01-30 NOTE — H&P (Signed)
Urology Preoperative H&P   Chief Complaint: Right kidney stone  History of Present Illness: Erica [name] is a 36 y.o. 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  She is here today for repeat right ureteroscopy 2/2 to a large amount residual stone seen on KUB in the office.  She denies right flank pain, dysuria, hematuria, fever/chills or nausea/vomiting.   Past Medical History:  Diagnosis Date  . Anemia   . History of kidney stones   . Hypertension    [facility]was referred to PCP but patient did not follow up, no meds    Past Surgical History:  Procedure Laterality Date  . CHOLECYSTECTOMY N/A 09/10/2016   Procedure: LAPAROSCOPIC CHOLECYSTECTOMY WITH INTRAOPERATIVE CHOLANGIOGRAM;  Surgeon: Darnell LevelGerkin, Todd, MD;  [facility];  Service: General;  Laterality: N/A;  . CYSTOSCOPY/URETEROSCOPY/HOLMIUM LASER/STENT PLACEMENT Right 01/10/2017   Procedure: CYSTOSCOPY/URETEROSCOPY [place] Charity/HOLMIUM LASER/STENT REPLACEMENT;  Surgeon: Rene PaciWinter, Christopher Aaron, MD;  [facility];  Service: Urology;  Laterality: Right;  . HOLMIUM LASER APPLICATION Right 01/10/2017   Procedure: HOLMIUM LASER APPLICATION;  Surgeon: Rene PaciWinter, Christopher Aaron, MD;  [facility];  Service: Urology;  Laterality: Right;  . IR URETERAL STENT RIGHT NEW ACCESS W/O SEP NEPHROSTOMY CATH  12/31/2016  . LAPAROSCOPIC BILATERAL SALPINGECTOMY Bilateral 08/12/2016   Procedure: LAPAROSCOPIC BILATERAL SALPINGECTOMY;  Surgeon: Reva BoresPratt, Tanya S, MD;  Location: [facility];  Service: Gynecology;  Laterality: Bilateral;  . NEPHROLITHOTOMY Right 12/31/2016   Procedure: NEPHROLITHOTOMY PERCUTANEOUS;  Surgeon: Rene PaciWinter, Christopher Aaron, MD;  Location: [facility];  Service: Urology;  Laterality: Right;  . TUBAL LIGATION      Allergies: No Known  Allergies  Family History  Problem Relation Age of Onset  . High Cholesterol Mother     Social History:  reports that  has never smoked. she has never used smokeless tobacco. She reports that she does not drink alcohol or use drugs.  ROS: A complete review of systems was performed.  All systems are negative except for pertinent findings as noted.  Physical Exam:  Vital signs in last 24 hours:   Constitutional:  Alert and oriented, No acute distress Cardiovascular: Regular rate and rhythm, No JVD Respiratory: Normal respiratory effort, Lungs clear bilaterally GI: Abdomen is soft, nontender, nondistended, no abdominal masses GU: No CVA tenderness Lymphatic: No lymphadenopathy Neurologic: Grossly intact, no focal deficits Psychiatric: Normal mood and affect  Laboratory Data:  No results for input(s): WBC, HGB, HCT, PLT in the last 72 hours.  No results for input(s): NA, K, CL, GLUCOSE, BUN, CALCIUM, CREATININE in the last 72 hours.  Invalid input(s): CO3   No results found for this or any previous visit (from the past 24 hour(s)). No results found for this or any previous visit (from the past 240 hour(s)).  Renal Function: No results for input(s): CREATININE in the last 168 hours. CrCl cannot be calculated (Patient's most recent lab result is older than the maximum 21 days allowed.).  Radiologic Imaging: No results found.  I independently reviewed the above imaging studies.  Assessment and Plan Erica [name] is a 36 y.o. female with a right staghorn calculus  -The risks, benefits and alternatives of cystoscopy with right ureteroscopy, laser lithotripsy and right JJ stent placement was discussed with the patient.  She voices understanding and wishes to proceed.    Rhoderick Moodyhristopher Winter,  MD 01/30/2017, 9:32 AM  [facility] Pager: [phone]

## 2017-01-31 ENCOUNTER — Encounter (HOSPITAL_BASED_OUTPATIENT_CLINIC_OR_DEPARTMENT_OTHER): Payer: Self-pay | Admitting: Certified Registered"

## 2017-01-31 ENCOUNTER — Ambulatory Visit (HOSPITAL_BASED_OUTPATIENT_CLINIC_OR_DEPARTMENT_OTHER)
Admission: RE | Admit: 2017-01-31 | Discharge: 2017-01-31 | Disposition: A | Discharge status: Home / Self Care | Payer: Managed Care, Other (non HMO) | Source: Ambulatory Visit | Attending: Urology | Admitting: Urology

## 2017-01-31 ENCOUNTER — Ambulatory Visit (HOSPITAL_BASED_OUTPATIENT_CLINIC_OR_DEPARTMENT_OTHER): Payer: Managed Care, Other (non HMO) | Admitting: Anesthesiology

## 2017-01-31 ENCOUNTER — Encounter (HOSPITAL_BASED_OUTPATIENT_CLINIC_OR_DEPARTMENT_OTHER)
Admission: RE | Disposition: A | Discharge status: Home / Self Care | Payer: Self-pay | Source: Ambulatory Visit | Attending: Urology

## 2017-01-31 DIAGNOSIS — Z8349 Family history of other endocrine, nutritional and metabolic diseases: Secondary | ICD-10-CM | POA: Diagnosis not present

## 2017-01-31 DIAGNOSIS — Z9889 Other specified postprocedural states: Secondary | ICD-10-CM | POA: Diagnosis not present

## 2017-01-31 DIAGNOSIS — Z9079 Acquired absence of other genital organ(s): Secondary | ICD-10-CM | POA: Insufficient documentation

## 2017-01-31 DIAGNOSIS — Z87442 Personal history of urinary calculi: Secondary | ICD-10-CM | POA: Insufficient documentation

## 2017-01-31 DIAGNOSIS — I10 Essential (primary) hypertension: Secondary | ICD-10-CM | POA: Diagnosis not present

## 2017-01-31 DIAGNOSIS — Z9049 Acquired absence of other specified parts of digestive tract: Secondary | ICD-10-CM | POA: Insufficient documentation

## 2017-01-31 DIAGNOSIS — N200 Calculus of kidney: Secondary | ICD-10-CM | POA: Insufficient documentation

## 2017-01-31 HISTORY — PX: HOLMIUM LASER APPLICATION: SHX5852

## 2017-01-31 HISTORY — PX: URETEROSCOPY WITH HOLMIUM LASER LITHOTRIPSY: SHX6645

## 2017-01-31 SURGERY — URETEROSCOPY, WITH LITHOTRIPSY USING HOLMIUM LASER
Anesthesia: General | Site: Renal | Laterality: Right

## 2017-01-31 MED ORDER — MEPERIDINE HCL 25 MG/ML IJ SOLN
INTRAMUSCULAR | Status: DC | PRN
  Administered 2017-01-31: 12.5 mg via INTRAVENOUS

## 2017-01-31 MED ORDER — BELLADONNA ALKALOIDS-OPIUM 16.2-60 MG RE SUPP
RECTAL | Status: AC
  Filled 2017-01-31: qty 1

## 2017-01-31 MED ORDER — DEXAMETHASONE SODIUM PHOSPHATE 10 MG/ML IJ SOLN
INTRAMUSCULAR | Status: AC
  Filled 2017-01-31: qty 1

## 2017-01-31 MED ORDER — KETOROLAC TROMETHAMINE 30 MG/ML IJ SOLN
INTRAMUSCULAR | Status: AC
  Filled 2017-01-31: qty 1

## 2017-01-31 MED ORDER — MIDAZOLAM HCL 2 MG/2ML IJ SOLN
INTRAMUSCULAR | Status: AC
  Filled 2017-01-31: qty 2

## 2017-01-31 MED ORDER — CIPROFLOXACIN IN D5W 400 MG/200ML IV SOLN
400.0000 mg | Freq: Once | INTRAVENOUS | Status: DC
  Filled 2017-01-31: qty 200

## 2017-01-31 MED ORDER — PROMETHAZINE HCL 25 MG/ML IJ SOLN
6.2500 mg | INTRAMUSCULAR | Status: DC | PRN
  Filled 2017-01-31: qty 1

## 2017-01-31 MED ORDER — ONDANSETRON HCL 4 MG/2ML IJ SOLN
INTRAMUSCULAR | Status: AC
  Filled 2017-01-31: qty 2

## 2017-01-31 MED ORDER — PROPOFOL 10 MG/ML IV BOLUS
INTRAVENOUS | Status: AC
  Filled 2017-01-31: qty 20

## 2017-01-31 MED ORDER — FENTANYL CITRATE (PF) 100 MCG/2ML IJ SOLN
INTRAMUSCULAR | Status: DC | PRN
  Administered 2017-01-31 (×4): 25 ug via INTRAVENOUS
  Administered 2017-01-31: 50 ug via INTRAVENOUS
  Administered 2017-01-31 (×2): 25 ug via INTRAVENOUS

## 2017-01-31 MED ORDER — ONDANSETRON HCL 4 MG/2ML IJ SOLN
INTRAMUSCULAR | Status: DC | PRN
  Administered 2017-01-31: 4 mg via INTRAVENOUS

## 2017-01-31 MED ORDER — MIDAZOLAM HCL 2 MG/2ML IJ SOLN
INTRAMUSCULAR | Status: DC | PRN
  Administered 2017-01-31: 2 mg via INTRAVENOUS

## 2017-01-31 MED ORDER — LACTATED RINGERS IV SOLN
INTRAVENOUS | Status: DC
  Administered 2017-01-31: 1000 mL via INTRAVENOUS
  Administered 2017-01-31: 15:00:00 via INTRAVENOUS
  Filled 2017-01-31: qty 1000

## 2017-01-31 MED ORDER — LIDOCAINE 2% (20 MG/ML) 5 ML SYRINGE
INTRAMUSCULAR | Status: AC
  Filled 2017-01-31: qty 5

## 2017-01-31 MED ORDER — FENTANYL CITRATE (PF) 100 MCG/2ML IJ SOLN
INTRAMUSCULAR | Status: AC
  Filled 2017-01-31: qty 2

## 2017-01-31 MED ORDER — SODIUM CHLORIDE 0.9 % IR SOLN
Status: DC | PRN
  Administered 2017-01-31: 4000 mL

## 2017-01-31 MED ORDER — CIPROFLOXACIN HCL 500 MG PO TABS: 500.0000 mg | ORAL_TABLET | Freq: Two times a day (BID) | ORAL | 0 refills | Status: AC

## 2017-01-31 MED ORDER — LIDOCAINE 2% (20 MG/ML) 5 ML SYRINGE
INTRAMUSCULAR | Status: DC | PRN
  Administered 2017-01-31: 60 mg via INTRAVENOUS

## 2017-01-31 MED ORDER — KETOROLAC TROMETHAMINE 30 MG/ML IJ SOLN
30.0000 mg | Freq: Once | INTRAMUSCULAR | Status: DC | PRN
  Filled 2017-01-31: qty 1

## 2017-01-31 MED ORDER — CIPROFLOXACIN IN D5W 400 MG/200ML IV SOLN
INTRAVENOUS | Status: AC
  Filled 2017-01-31: qty 200

## 2017-01-31 MED ORDER — IOHEXOL 300 MG/ML  SOLN
INTRAMUSCULAR | Status: DC | PRN
  Administered 2017-01-31: 10 mL

## 2017-01-31 MED ORDER — PROPOFOL 10 MG/ML IV BOLUS
INTRAVENOUS | Status: DC | PRN
  Administered 2017-01-31: 140 mg via INTRAVENOUS

## 2017-01-31 MED ORDER — KETOROLAC TROMETHAMINE 10 MG PO TABS: 10.0000 mg | ORAL_TABLET | Freq: Four times a day (QID) | ORAL | 0 refills | Status: DC | PRN

## 2017-01-31 MED ORDER — DEXAMETHASONE SODIUM PHOSPHATE 10 MG/ML IJ SOLN
INTRAMUSCULAR | Status: DC | PRN
  Administered 2017-01-31: 10 mg via INTRAVENOUS

## 2017-01-31 MED ORDER — FENTANYL CITRATE (PF) 100 MCG/2ML IJ SOLN
25.0000 ug | INTRAMUSCULAR | Status: DC | PRN
  Filled 2017-01-31: qty 1

## 2017-01-31 MED ORDER — KETOROLAC TROMETHAMINE 30 MG/ML IJ SOLN
INTRAMUSCULAR | Status: DC | PRN
  Administered 2017-01-31: 30 mg via INTRAVENOUS

## 2017-01-31 MED ORDER — MEPERIDINE HCL 25 MG/ML IJ SOLN
INTRAMUSCULAR | Status: AC
  Filled 2017-01-31: qty 1

## 2017-01-31 SURGICAL SUPPLY — 35 items
APL SKNCLS STERI-STRIP NONHPOA (GAUZE/BANDAGES/DRESSINGS)
BAG DRAIN URO-CYSTO SKYTR STRL (DRAIN) ×3 IMPLANT
BAG DRN UROCATH (DRAIN) ×1
BASKET STONE 1.7 NGAGE (UROLOGICAL SUPPLIES) ×2 IMPLANT
BASKET ZERO TIP NITINOL 2.4FR (BASKET) ×1 IMPLANT
BENZOIN TINCTURE PRP APPL 2/3 (GAUZE/BANDAGES/DRESSINGS) IMPLANT
BSKT STON RTRVL ZERO TP 2.4FR (BASKET)
CATH INTERMIT  6FR 70CM (CATHETERS) ×2 IMPLANT
CATH URET DUAL LUMEN 6-10FR 50 (CATHETERS) ×2 IMPLANT
CLOSURE WOUND 1/2 X4 (GAUZE/BANDAGES/DRESSINGS)
CLOTH BEACON ORANGE TIMEOUT ST (SAFETY) ×3 IMPLANT
FIBER LASER FLEXIVA 365 (UROLOGICAL SUPPLIES) IMPLANT
FIBER LASER TRAC TIP (UROLOGICAL SUPPLIES) ×2 IMPLANT
GLOVE BIO SURGEON STRL SZ7.5 (GLOVE) ×3 IMPLANT
GLOVE BIO SURGEON STRL SZ8.5 (GLOVE) ×2 IMPLANT
GLOVE BIOGEL PI IND STRL 8.5 (GLOVE) IMPLANT
GLOVE BIOGEL PI INDICATOR 8.5 (GLOVE) ×2
GOWN STRL REUS W/TWL XL LVL3 (GOWN DISPOSABLE) ×4 IMPLANT
GUIDEWIRE ANG ZIPWIRE 038X150 (WIRE) ×5 IMPLANT
GUIDEWIRE STR DUAL SENSOR (WIRE) ×2 IMPLANT
INFUSOR MANOMETER BAG 3000ML (MISCELLANEOUS) ×3 IMPLANT
IV NS 1000ML (IV SOLUTION) ×6
IV NS 1000ML BAXH (IV SOLUTION) IMPLANT
IV NS IRRIG 3000ML ARTHROMATIC (IV SOLUTION) ×3 IMPLANT
KIT RM TURNOVER CYSTO AR (KITS) ×3 IMPLANT
MANIFOLD NEPTUNE II (INSTRUMENTS) ×3 IMPLANT
NS IRRIG 500ML POUR BTL (IV SOLUTION) ×6 IMPLANT
PACK CYSTO (CUSTOM PROCEDURE TRAY) ×3 IMPLANT
SHEATH URET ACCESS 12FR/35CM (UROLOGICAL SUPPLIES) ×2 IMPLANT
STENT CONTOUR NO GW 8FR 26CM (STENTS) ×2 IMPLANT
STRIP CLOSURE SKIN 1/2X4 (GAUZE/BANDAGES/DRESSINGS) IMPLANT
SYRINGE 10CC LL (SYRINGE) ×3 IMPLANT
SYRINGE 20CC LL (MISCELLANEOUS) ×2 IMPLANT
TUBE CONNECTING 12'X1/4 (SUCTIONS)
TUBE CONNECTING 12X1/4 (SUCTIONS) IMPLANT

## 2017-01-31 NOTE — Transfer of Care (Signed)
Immediate Anesthesia Transfer of Care Note  Patient: Erica [name]  Procedure(s) Performed: Procedure(s) (LRB): CYSTOSCOPY, URETEROSCOPY WITH HOLMIUM LASER / LITHOTRIPSY, STONE BASKETRY / STENT REPLACEMENT (Right) HOLMIUM LASER APPLICATION (Right)  Patient Location: PACU  Anesthesia Type: General  Level of Consciousness: awake, oriented, sedated and patient cooperative  Airway & Oxygen Therapy: Patient Spontanous Breathing and Patient connected to face mask oxygen  Post-op Assessment: Report given to PACU RN and Post -op Vital signs reviewed and stable  Post vital signs: Reviewed and stable  Complications: No apparent anesthesia complications  Last Vitals:  Vitals:   01/31/17 1413 01/31/17 1415  BP: (!) 156/118 (!) 201/176  Pulse: (!) 120 (!) 122  Resp: 20 18  Temp: 37.1 C   SpO2: 100% 100%    Last Pain:  Vitals:   01/31/17 1106  TempSrc: Oral      Patients Stated Pain Goal: 4 (01/31/17 1155)

## 2017-01-31 NOTE — Anesthesia Procedure Notes (Signed)
Performed by: Kellis Topete D, CRNA       

## 2017-01-31 NOTE — Discharge Instructions (Signed)
Post Anesthesia Home Care Instructions  Activity: Get plenty of rest for the remainder of the day. A responsible individual must stay with you for 24 hours following the procedure.  For the next 24 hours, DO NOT: -Drive a car -Operate machinery -Drink alcoholic beverages -Take any medication unless instructed by your physician -Make any legal decisions or sign important papers.  Meals: Start with liquid foods such as gelatin or soup. Progress to regular foods as tolerated. Avoid greasy, spicy, heavy foods. If nausea and/or vomiting occur, drink only clear liquids until the nausea and/or vomiting subsides. Call your physician if vomiting continues.  Special Instructions/Symptoms: Your throat may feel dry or sore from the anesthesia or the breathing tube placed in your throat during surgery. If this causes discomfort, gargle with warm salt water. The discomfort should disappear within 24 hours.  If you had a scopolamine patch placed behind your ear for the management of post- operative nausea and/or vomiting:  1. The medication in the patch is effective for 72 hours, after which it should be removed.  Wrap patch in a tissue and discard in the trash. Wash hands thoroughly with soap and water. 2. You may remove the patch earlier than 72 hours if you experience unpleasant side effects which may include dry mouth, dizziness or visual disturbances. 3. Avoid touching the patch. Wash your hands with soap and water after contact with the patch.      [facility] [phone] Post Ureteroscopy With or Without Stent Instructions  Definitions:  Ureter: The duct that transports urine from the kidney to the bladder. Stent:   A plastic hollow tube that is placed into the ureter, from the kidney to the bladder to prevent the ureter from swelling shut.  GENERAL INSTRUCTIONS:  Despite the fact that no skin incisions were used, the area around the ureter and bladder is raw and  irritated. The stent is a foreign body which will further irritate the bladder wall. This irritation is manifested by increased frequency of urination, both day and night, and by an increase in the urge to urinate. In some, the urge to urinate is present almost always. Sometimes the urge is strong enough that you may not be able to stop yourself from urinating. The only real cure is to remove the stent and then give time for the bladder wall to heal which can't be done until the danger of the ureter swelling shut has passed, which varies.  You may see some blood in your urine while the stent is in place and a few days afterwards. Do not be alarmed, even if the urine was clear for a while. Get off your feet and drink lots of fluids until clearing occurs. If you start to pass clots or don't improve, call us.  DIET: You may return to your normal diet immediately. Because of the raw surface of your bladder, alcohol, spicy foods, acid type foods and drinks with caffeine may cause irritation or frequency and should be used in moderation. To keep your urine flowing freely and to avoid constipation, drink plenty of fluids during the day ( 8-10 glasses ). Tip: Avoid cranberry juice because it is very acidic.  ACTIVITY: Your physical activity doesn't need to be restricted. However, if you are very active, you may see some blood in your urine. We suggest that you reduce your activity under these circumstances until the bleeding has stopped.  BOWELS: It is important to keep your bowels regular during the postoperative period. Straining   with bowel movements can cause bleeding. A bowel movement every other day is reasonable. Use a mild laxative if needed, such as Milk of Magnesia 2-3 tablespoons, or 2 Dulcolax tablets. Call if you continue to have problems. If you have been taking narcotics for pain, before, during or after your surgery, you may be constipated. Take a laxative if necessary.   MEDICATION: You  should resume your pre-surgery medications unless told not to. In addition you will often be given an antibiotic to prevent infection. These should be taken as prescribed until the bottles are finished unless you are having an unusual reaction to one of the drugs.  PROBLEMS YOU SHOULD REPORT TO US: Fevers over 100.5 Fahrenheit. Heavy bleeding, or clots ( See above notes about blood in urine ). Inability to urinate. Drug reactions ( hives, rash, nausea, vomiting, diarrhea ). Severe burning or pain with urination that is not improving.  FOLLOW-UP: You will need a follow-up appointment to monitor your progress. Call for this appointment at the number listed above. Usually the first appointment will be about three to fourteen days after your surgery.      

## 2017-01-31 NOTE — Progress Notes (Signed)
Received call from Dr. Liliane ShiWinter.  Dr. Liliane ShiWinter informed of low grade temp, hr, bp as compared to admission this am on arrival.  Pt denies pain.  No new orders received.  Pt instructed to start antibx tonight and if temp > 101, she should call the office.  Pt informed and verbalized her understanding.

## 2017-01-31 NOTE — Op Note (Signed)
Operative Note  Preoperative diagnosis:  1.  Right staghorn stone measuring 3 cm  Postoperative diagnosis: 1.  Same  Procedure(s): 1.  Cystoscopy 2.  Right JJ stent removal 3.  Right ureteroscopy 4.  Laser lithotripsy 5.  Right JJ stent placement  Surgeon: Rhoderick Moodyhristopher Liseth Wann, MD  Assistants:  None  Anesthesia:  Gen LMA  Complications:  None  EBL:  <5 mL  Specimens: 1.  Right renal stone fragments 2.  Previously placed right JJ stent  Drains/Catheters: 1.  Right 8 French x 26 cm JJ stent  Intraoperative findings:   1.  Multiple residual stone fragments cumulatively measuring 3 cm  2.  The residual stone fragments were lithotripsied into submillimeter fragments  Indication:  Erica [name] is a [age] y.o. 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  She is here today for repeat right ureteroscopy 2/2 to a large amount residual stone seen on KUB in the office.  She denies right flank pain, dysuria, hematuria, fever/chills or nausea/vomiting.  Description of procedure:  After informed consent was obtained, the patient was brought to the operating room and general LMA anesthesia was administered. The patient was then placed in the dorsolithotomy position and prepped and draped in usual sterile fashion. A timeout was performed. A 21 French rigid cystoscope was then inserted into the urethral meatus and advanced into the bladder under direct vision. A complete bladder survey revealed no intravesical pathology.  Her previously placed right JJ stent was grasped and retracted to the urethral meatus.  Clefted glans are was then advanced to the lumen of the stent and up to the right renal pelvis, under fluoroscopic guidance.  The previously placed stent was removed over the wire was found to be intact and was discarded.    A  dual-lumen catheter was advanced over the previously placed wire and into position right ureter.  An additional safety wire was then placed through the second lumen of the ureteral catheter into the right renal pelvis, and fluoroscopic guidance.  A ureteral access sheath was advanced over the working wire and into position within the proximal right ureter, confirming placement by fluoroscopy.  A flexible ureteroscope was advanced through the lumen of the access sheath and into the renal pelvis.  Inspection of the right renal pelvis reveals stone fragments cumulatively measuring approximately 3 cm.   A 200  holmium laser  was then used to extensively fragment the remaining stones to submillimeter pieces.  The right renal pelvis and extensively flushed with ureteroscope and the larger stone fragments were removed with a nitinol basket.  The flexible ureteroscope and access sheath were then removed, and direct vision revealing ureteral trauma.  An 8 [place] JJ stent was placed over the wire and into good position in the right collecting system, confirming placement by fluoroscopy.  The patient's bladder was then completely drained.  Her silk suture was placed at her initial PCNL was removed.  She tolerated the procedure well and transferred to the postanesthesia unit in stable condition.  Plan:  Follow-up on 12/22/2016 for office cystoscopy and stent removal

## 2017-01-31 NOTE — Interval H&P Note (Signed)
History and Physical Interval Note:  01/31/2017 12:31 PM  Erica MastersYovani Pineda-Reyes  has presented today for surgery, with the diagnosis of RIGHT RENAL STONES  The various methods of treatment have been discussed with the patient and family. After consideration of risks, benefits and other options for treatment, the patient has consented to  Procedure(s): URETEROSCOPY WITH HOLMIUM LASER / LITHOTRIPSY/STENT (Right) HOLMIUM LASER APPLICATION as a surgical intervention .  The patient's history has been reviewed, patient examined, no change in status, stable for surgery.  I have reviewed the patient's chart and labs.  Questions were answered to the patient's satisfaction.     Dorian Furnacehristopher Aaron Addysin Porco

## 2017-01-31 NOTE — Anesthesia Procedure Notes (Signed)
Procedure Name: LMA Insertion Date/Time: 01/31/2017 12:44 PM Performed by: Francie MassingHazel, [place] Yearby D, CRNA Pre-anesthesia Checklist: Patient identified, Emergency Drugs available, Suction available and Patient being monitored Patient Re-evaluated:Patient Re-evaluated prior to induction Oxygen Delivery Method: Circle system utilized Preoxygenation: Pre-oxygenation with 100% oxygen Induction Type: IV induction Ventilation: Mask ventilation without difficulty LMA: LMA inserted LMA Size: 4.0 Number of attempts: 1 Airway Equipment and Method: Bite block Placement Confirmation: positive ETCO2 Tube secured with: Tape Dental Injury: Teeth and Oropharynx as per pre-operative assessment

## 2017-01-31 NOTE — Anesthesia Preprocedure Evaluation (Signed)
Anesthesia Evaluation  Patient identified by MRN, date of birth, ID band Patient awake    Reviewed: Allergy & Precautions, NPO status , Patient's Chart, lab work & pertinent test results  Airway Mallampati: II  TM Distance: >3 FB Neck ROM: Full    Dental no notable dental hx.    Pulmonary neg pulmonary ROS,    Pulmonary exam normal breath sounds clear to auscultation       Cardiovascular hypertension, Normal cardiovascular exam Rhythm:Regular Rate:Normal     Neuro/Psych negative neurological ROS  negative psych ROS   GI/Hepatic negative GI ROS, Neg liver ROS,   Endo/Other  negative endocrine ROS  Renal/GU negative Renal ROS  negative genitourinary   Musculoskeletal negative musculoskeletal ROS (+)   Abdominal   Peds negative pediatric ROS (+)  Hematology negative hematology ROS (+)   Anesthesia Other Findings   Reproductive/Obstetrics negative OB ROS                             Anesthesia Physical Anesthesia Plan  ASA: II  Anesthesia Plan: General   Post-op Pain Management:    Induction: Intravenous  PONV Risk Score and Plan: 3 and Ondansetron, Dexamethasone and Treatment may vary due to age or medical condition  Airway Management Planned: LMA  Additional Equipment:   Intra-op Plan:   Post-operative Plan: Extubation in OR  Informed Consent: I have reviewed the patients History and Physical, chart, labs and discussed the procedure including the risks, benefits and alternatives for the proposed anesthesia with the patient or authorized representative who has indicated his/her understanding and acceptance.     Dental advisory given  Plan Discussed with: CRNA and Surgeon  Anesthesia Plan Comments:         Anesthesia Quick Evaluation  

## 2017-02-03 NOTE — Anesthesia Postprocedure Evaluation (Signed)
Anesthesia Post Note  Patient: Icelynn Pineda-Reyes  Procedure(s) Performed: CYSTOSCOPY, URETEROSCOPY WITH HOLMIUM LASER / LITHOTRIPSY, STONE BASKETRY / STENT REPLACEMENT (Right Renal) HOLMIUM LASER APPLICATION (Right Renal)     Patient location during evaluation: PACU Anesthesia Type: General Level of consciousness: awake and alert Pain management: pain level controlled Vital Signs Assessment: post-procedure vital signs reviewed and stable Respiratory status: spontaneous breathing, nonlabored ventilation, respiratory function stable and patient connected to nasal cannula oxygen Cardiovascular status: blood pressure returned to baseline and stable Postop Assessment: no apparent nausea or vomiting Anesthetic complications: no    Last Vitals:  Vitals:   01/31/17 1500 01/31/17 1545  BP: 124/73 103/63  Pulse: (!) 119 (!) 126  Resp: 17 16  Temp:  37.7 C  SpO2: 99% 100%    Last Pain:  Vitals:   01/31/17 1106  TempSrc: Oral                 Shelton SilvasKevin D Yahaira Bruski

## 2017-02-04 ENCOUNTER — Encounter (HOSPITAL_BASED_OUTPATIENT_CLINIC_OR_DEPARTMENT_OTHER): Payer: Self-pay | Admitting: Urology

## 2017-02-05 ENCOUNTER — Other Ambulatory Visit: Payer: Self-pay

## 2017-02-05 ENCOUNTER — Ambulatory Visit: Payer: Managed Care, Other (non HMO) | Admitting: Family Medicine

## 2017-02-05 ENCOUNTER — Encounter: Payer: Self-pay | Admitting: Family Medicine

## 2017-02-05 VITALS — BP 110/62 | HR 108 | Temp 99.2°F | Resp 16 | Ht 62.0 in | Wt 157.6 lb

## 2017-02-05 DIAGNOSIS — B001 Herpesviral vesicular dermatitis: Secondary | ICD-10-CM | POA: Diagnosis not present

## 2017-02-05 DIAGNOSIS — N200 Calculus of kidney: Secondary | ICD-10-CM

## 2017-02-05 DIAGNOSIS — R509 Fever, unspecified: Secondary | ICD-10-CM | POA: Diagnosis not present

## 2017-02-05 DIAGNOSIS — N23 Unspecified renal colic: Secondary | ICD-10-CM | POA: Diagnosis not present

## 2017-02-05 LAB — POCT URINALYSIS DIP (MANUAL ENTRY)
BILIRUBIN UA: NEGATIVE
Glucose, UA: NEGATIVE mg/dL
Ketones, POC UA: NEGATIVE mg/dL
NITRITE UA: NEGATIVE
PH UA: 7 (ref 5.0–8.0)
Spec Grav, UA: 1.01 (ref 1.010–1.025)
UROBILINOGEN UA: 0.2 U/dL

## 2017-02-05 LAB — POC MICROSCOPIC URINALYSIS (UMFC): MUCUS RE: ABSENT

## 2017-02-05 MED ORDER — DOCOSANOL 10 % EX CREA: TOPICAL_CREAM | CUTANEOUS | 0 refills | Status: DC

## 2017-02-05 MED ORDER — SULFAMETHOXAZOLE-TRIMETHOPRIM 800-160 MG PO TABS: 1.0000 | ORAL_TABLET | Freq: Two times a day (BID) | ORAL | 0 refills | Status: AC

## 2017-02-05 NOTE — Progress Notes (Signed)
Chief Complaint  Patient presents with  . ha, chills, ? fever, hot cold/sweats    onset: Friday after surgery for removal of kidney stones, pt c/o gas, ha's , chills, sweat and has a cold sore on lip and chin.  Per pt no cold sxs just lots of hot and cold episodes.  Pt is experiencing nausea and is taking zofran for it  but no other meds taken for other sxs.    HPI  Pt reports that she has been having chills, shivering and headaches She reports that she also developed some oral cold sore lesions She states that since her surgery on Friday 01/31/2017 she had cystoscopy, ureteroscopy with lithotripsy, stone basketry, stent placement.  She was discharged home on Cipro 500mg  bid x3 days which she completed. She was given Toradol for pain but she is not taking those.  She reports that she gets feeling of gas in her epigastric area  She reports that the gas pain radiates to her back  She has been taking zofran which helps with her nausea She does not take ibuprofen or tylenol.  She has been drinking water and denies any pain today She would like to address the cold sore   Past Medical History:  Diagnosis Date  . Anemia   . Dysuria   . History of kidney stones   . Hypertension    [facility]was referred to PCP but patient did not follow up, no meds  . Renal calculus, right     Current Outpatient Medications  Medication Sig Dispense Refill  . acetaminophen (TYLENOL) 500 MG tablet Take 1,000 mg by mouth every 6 (six) hours as needed for moderate pain or headache.     . Docosanol (ABREVA) 10 % CREA Apply topically 2-3 times a day 2 g 0  . ketorolac (TORADOL) 10 MG tablet Take 1 tablet (10 mg total) by mouth every 6 (six) hours as needed for moderate pain. 20 tablet 0  . ondansetron (ZOFRAN-ODT) 8 MG disintegrating tablet Take 1 tablet (8 mg total) every 8 (eight) hours as needed by mouth for nausea. 30 tablet 0  . sulfamethoxazole-trimethoprim (BACTRIM DS,SEPTRA DS) 800-160 MG  tablet Take 1 tablet by mouth 2 (two) times daily for 5 days. 10 tablet 0   No current facility-administered medications for this visit.     Allergies: No Active Allergies  Past Surgical History:  Procedure Laterality Date  . CHOLECYSTECTOMY N/A 09/10/2016   Procedure: LAPAROSCOPIC CHOLECYSTECTOMY WITH INTRAOPERATIVE CHOLANGIOGRAM;  Surgeon: Darnell LevelGerkin, Todd, MD;  [facility];  Service: General;  Laterality: N/A;  . CYSTOSCOPY/URETEROSCOPY/HOLMIUM LASER/STENT PLACEMENT Right 01/10/2017   Procedure: CYSTOSCOPY/URETEROSCOPY [place]/HOLMIUM LASER/STENT REPLACEMENT;  Surgeon: Rene PaciWinter, Christopher Aaron, MD;  [facility] [place];  Service: Urology;  Laterality: Right;  . HOLMIUM LASER APPLICATION Right 01/10/2017   Procedure: HOLMIUM LASER APPLICATION;  Surgeon: Rene PaciWinter, Christopher Aaron, MD;  [facility] [place];  Service: Urology;  Laterality: Right;  . HOLMIUM LASER APPLICATION Right 01/31/2017   Procedure: HOLMIUM LASER APPLICATION;  Surgeon: Rene PaciWinter, Christopher Aaron, MD;  [facility] [place];  Service: Urology;  Laterality: Right;  . IR URETERAL STENT RIGHT NEW ACCESS W/O SEP NEPHROSTOMY CATH  12/31/2016  . LAPAROSCOPIC BILATERAL SALPINGECTOMY Bilateral 08/12/2016   Procedure: LAPAROSCOPIC BILATERAL SALPINGECTOMY;  Surgeon: Reva BoresPratt, Tanya S, MD;  Location: [facility];  Service: Gynecology;  Laterality: Bilateral;  . NEPHROLITHOTOMY Right 12/31/2016   Procedure: NEPHROLITHOTOMY PERCUTANEOUS;  Surgeon: Rene PaciWinter, Christopher Aaron, MD;  Location: [facility];  Service: Urology;  Laterality: Right;  . URETEROSCOPY WITH HOLMIUM LASER LITHOTRIPSY Right 01/31/2017   Procedure: CYSTOSCOPY, URETEROSCOPY WITH HOLMIUM LASER / LITHOTRIPSY, STONE BASKETRY / STENT REPLACEMENT;  Surgeon: Rene PaciWinter, Christopher Aaron, MD;  [facility] [place];  Service: Urology;  Laterality: Right;    Social History   Socioeconomic History  . Marital status: Married    Spouse  name: None  . Number of children: 3  . Years of education: None  . Highest education level: None  Social Needs  . Financial resource strain: None  . Food insecurity - worry: None  . Food insecurity - inability: None  . Transportation needs - medical: None  . Transportation needs - non-medical: None  Occupational History  . None  Tobacco Use  . Smoking status: Never Smoker  . Smokeless tobacco: Never Used  Substance and Sexual Activity  . Alcohol use: No  . Drug use: No  . Sexual activity: Yes    Birth control/protection: Surgical  Other Topics Concern  . None  Social History Narrative  . None    Family History  Problem Relation Age of Onset  . High Cholesterol Mother      ROS Review of Systems See HPI Constitution: No fevers or chills No malaise No diaphoresis Skin: No rash or itching Eyes: no blurry vision, no double vision GI: no diarrhea, no constipation, +gassiness Neuro: no dizziness or headaches all others reviewed and negative   Objective: Vitals:   02/05/17 1054 02/05/17 1121  BP: 110/62   Pulse: (!) 130 (!) 108  Resp: 16   Temp: 99.2 F (37.3 C)   TempSrc: Oral   SpO2: 99%   Weight: 157 lb 9.6 oz (71.5 kg)   Height: 5\' 2"  (1.575 m)     Physical Exam Physical Exam  Constitutional: She is oriented to person, place, and time. She appears well-developed and well-nourished.  HENT:  Head: Normocephalic and atraumatic.  Eyes: Conjunctivae and EOM are normal.  Cardiovascular: Normal rate, regular rhythm and normal heart sounds.   Pulmonary/Chest: Effort normal and breath sounds normal. No respiratory distress. She has no wheezes.  Abdominal: Normal appearance and bowel sounds are normal. There is no tenderness. There is no CVA tenderness. mild suprapubic tenderness Neurological: She is alert and oriented to person, place, and time.   Assessment and Plan Sonny MastersYovani was seen today for ha, chills, ? fever, hot cold/sweats.  Diagnoses and all orders  for this visit:  Renal colic on right side Staghorn renal calculus Advised pt to follow up with Nephrology Since pt completed Cipro then will suggest Bactrim DS for additional coverage -     POCT Microscopic Urinalysis ([facility]) -     POCT urinalysis dipstick  Fever and chills -     POCT Microscopic Urinalysis ([facility]) -     POCT urinalysis dipstick  Cold sore-  For cold sores suggested Abreva and rest  Other orders -     sulfamethoxazole-trimethoprim (BACTRIM DS,SEPTRA DS) 800-160 MG tablet; Take 1 tablet by mouth 2 (two) times daily for 5 days. -     Docosanol (ABREVA) 10 % CREA; Apply topically 2-3 times a day     Zoe A [employer]

## 2017-02-05 NOTE — Patient Instructions (Addendum)
IF you received an x-ray today, you will receive an invoice from [facility]. Please contact [facility] at [phone] with questions or concerns regarding your invoice.   IF you received labwork today, you will receive an invoice from [facility]. Please contact [facility] at [phone] with questions or concerns regarding your invoice.   Our billing staff will not be able to assist you with questions regarding bills from these companies.  You will be contacted with the lab results as soon as they are available. The fastest way to get your results is to activate your My Chart account. Instructions are located on the last page of this paperwork. If you have not heard from us regarding the results in 2 weeks, please contact this office.     Cold Sore A cold sore, also called a fever blister, is a skin infection that causes small, fluid-filled sores to form inside of the mouth or on the lips, gums, nose, chin, or cheeks. Cold sores can spread to other parts of the body, such as the eyes or fingers. In some people with other medical conditions, cold sores can spread to multiple other body sites, including the genitals. Cold sores can be spread or passed from person to person (contagious) until the sores crust over completely. What are the causes? Cold sores are caused by the herpes simplex virus (HSV-1). HSV-1 is closely related to the virus that causes genital herpes (HSV-2), but these viruses are not the same. Once a person is infected with HSV-1, the virus remains permanently in the body. HSV-1 is spread from person to person through close contact, such as through kissing, touching the affected area, or sharing personal items such as lip balm, razors, or eating utensils. What increases the risk? A cold sore outbreak is more likely to develop in people who:  Are tired, stressed, or sick.  Are menstruating.  Are pregnant.  Take certain medicines.  Are exposed to cold  weather or too much sun.  What are the signs or symptoms? Symptoms of a cold sore outbreak often go through different stages. Here is how a cold sore develops:  Tingling, itching, or burning is felt 1-2 days before the outbreak.  Fluid-filled blisters appear on the lips, inside the mouth, on the nose, or on the cheeks.  The blisters start to ooze clear fluid.  The blisters dry up and a yellow crust appears in its place.  The crust falls off.  Other symptoms include:  Fever.  Sore throat.  Headache.  Muscle aches.  Swollen neck glands.  You also may not have any symptoms. How is this diagnosed? This condition is often diagnosed based on your medical history and a physical exam. Your health care provider may swab your sore and then examine it in the lab. Rarely, blood tests may be done to check for HSV-1. How is this treated? There is no cure for cold sores or HSV-1. There also is no vaccine for HSV-1. Most cold sores go away on their own without treatment within two weeks. Medicines cannot make the infection go away, but medicines can:  Help relieve some of the pain associated with the sores.  Work to stop the virus from multiplying.  Shorten healing time.  Medicines may be in the form of creams, gels, pills, or a shot. Follow these instructions at home: Medicines  Take or apply over-the-counter and prescription medicines only as told by your health care provider.  Use a cotton-tip swab to apply  creams or gels to your sores. Sore Care  Do not touch the sores or pick the scabs.  Wash your hands often. Do not touch your eyes without washing your hands first.  Keep the sores clean and dry.  If directed, apply ice to the sores: ? Put ice in a plastic bag. ? Place a towel between your skin and the bag. ? Leave the ice on for 20 minutes, 2-3 times per day. Lifestyle  Do not kiss, have oral sex, or share personal items until your sores heal.  Eat a soft, bland  diet. Avoid eating hot, cold, or salty foods. These can hurt your mouth.  Use a straw if it hurts to drink out of a glass.  Avoid the sun and limit your stress if these things trigger outbreaks. If sun causes cold sores, apply sunscreen on your lips before being out in the sun. Contact a health care provider if:  You have symptoms for more than two weeks.  You have pus coming from the sores.  You have redness that is spreading.  You have pain or irritation in your eye.  You get sores on your genitals.  Your sores do not heal within two weeks.  You have frequent cold sore outbreaks. Get help right away if:  You have a fever and your symptoms suddenly get worse.  You have a headache and confusion. This information is not intended to replace advice given to you by your health care provider. Make sure you discuss any questions you have with your health care provider. Document Released: 02/09/2000 Document Revised: 10/06/2015 Document Reviewed: 12/02/2014 Elsevier Interactive Patient Education  [employer]2018 Elsevier Inc.

## 2017-02-24 ENCOUNTER — Encounter: Payer: Self-pay | Admitting: Urgent Care

## 2017-02-24 ENCOUNTER — Ambulatory Visit (INDEPENDENT_AMBULATORY_CARE_PROVIDER_SITE_OTHER): Payer: Managed Care, Other (non HMO)

## 2017-02-24 ENCOUNTER — Ambulatory Visit: Payer: Managed Care, Other (non HMO) | Admitting: Urgent Care

## 2017-02-24 VITALS — BP 110/80 | HR 106 | Temp 98.2°F | Resp 18 | Ht 62.0 in | Wt 157.4 lb

## 2017-02-24 DIAGNOSIS — Z9049 Acquired absence of other specified parts of digestive tract: Secondary | ICD-10-CM | POA: Diagnosis not present

## 2017-02-24 DIAGNOSIS — K5900 Constipation, unspecified: Secondary | ICD-10-CM

## 2017-02-24 DIAGNOSIS — N23 Unspecified renal colic: Secondary | ICD-10-CM | POA: Diagnosis not present

## 2017-02-24 DIAGNOSIS — N200 Calculus of kidney: Secondary | ICD-10-CM | POA: Diagnosis not present

## 2017-02-24 DIAGNOSIS — Z9889 Other specified postprocedural states: Secondary | ICD-10-CM

## 2017-02-24 DIAGNOSIS — R110 Nausea: Secondary | ICD-10-CM

## 2017-02-24 DIAGNOSIS — R140 Abdominal distension (gaseous): Secondary | ICD-10-CM

## 2017-02-24 MED ORDER — DOCUSATE SODIUM 50 MG PO CAPS: 50.0000 mg | ORAL_CAPSULE | Freq: Two times a day (BID) | ORAL | 1 refills | Status: DC

## 2017-02-24 NOTE — Progress Notes (Signed)
  MRN: [id number] DOB: 06/10/1980  Subjective:   Erica [name] is a 36 y.o. female presenting for 4 day history of abdominal bloating, nausea without vomiting. Has not had a bowel movement for 3 days, reports that she has been using hydrocodone for her pain s/p lithotripsy, 02/10/2017. Denies fever, abdominal pain, cough, chest pain, dysuria, hematuria, pelvic pain.  Erica [name] has a current medication list which includes the following prescription(s): ondansetron and sulfamethoxazole-trimethoprim. Also has No Known Allergies.  Erica [name]  has a past medical history of Anemia, Dysuria, History of kidney stones, Hypertension, and Renal calculus, right. Also  has a past surgical history that includes Laparoscopic bilateral salpingectomy (Bilateral, 08/12/2016); IR URETERAL STENT RIGHT NEW ACCESS W/O SEP NEPHROSTOMY CATH (12/31/2016); Nephrolithotomy (Right, 12/31/2016); Cholecystectomy (N/A, 09/10/2016); Cystoscopy/ureteroscopy/holmium laser/stent placement (Right, 01/10/2017); Holmium laser application (Right, 01/10/2017); Ureteroscopy with holmium laser lithotripsy (Right, 01/31/2017); and Holmium laser application (Right, 01/31/2017).  Objective:   Vitals: BP 110/80   Pulse (!) 106   Temp 98.2 F (36.8 C) (Oral)   Resp 18   Ht 5\' 2"  (1.575 m)   Wt 157 lb 6.4 oz (71.4 kg)   SpO2 100%   BMI 28.79 kg/m   Physical Exam  Constitutional: She is oriented to person, place, and time. She appears well-developed and well-nourished.  HENT:  Mouth/Throat: Oropharynx is clear and moist.  Cardiovascular: Normal rate, regular rhythm and intact distal pulses. Exam reveals no gallop and no friction rub.  No murmur heard. Pulmonary/Chest: No respiratory distress. She has no wheezes. She has no rales.  Abdominal: Soft. Bowel sounds are normal. She exhibits no distension and no mass. There is tenderness (mild, generalized). There is no guarding.  Mild-moderate right sided CVA tenderness.  Neurological: She is  alert and oriented to person, place, and time.  Psychiatric: She has a normal mood and affect.   Dg Abd 2 Views  Result Date: 02/24/2017 CLINICAL DATA:  Abdominal pain, bloating, and nausea. Recent lithotripsy for right renal staghorn calculus. EXAM: ABDOMEN - 2 VIEW COMPARISON:  01/24/2017 from [facility]. FINDINGS: The bowel gas pattern is normal. There is no evidence of free air. Right upper quadrant surgical clips from prior cholecystectomy. Right ureteral stent has been removed since previous study. There has been significant decrease in stone burden in the right renal collecting system since previous study. Kidneys are partially obscured by overlying stool. There is a persistent calculus in the lower pole of the right kidney measuring 10 x 24 mm. IMPRESSION: Significant decrease in stone burden in right renal collecting system, with persistent 10 x 24 mm calculus in the right lower pole. Electronically Signed   By: Myles RosenthalJohn  Stahl M.D.   On: 02/24/2017 17:40    Assessment and Plan :   Nausea without vomiting - Plan: DG Abd 2 Views  Abdominal bloating  Constipation, unspecified constipation type - Plan: DG Abd 2 Views  Staghorn renal calculus  Renal colic on right side  S/P laparoscopic cholecystectomy - Plan: DG Abd 2 Views  H/O lithotripsy - Plan: DG Abd 2 Views  Will manage patient for constipation given recent use of hydrocodone, no bowel movement since 02/21/2017. Recommended aggressive hydration. Strict ER and return-to-clinic precautions discussed, patient verbalized understanding.   Wallis BambergMario Kenley Troop, PA-C [facility] at [facility] [phone] 02/24/2017  5:08 PM

## 2017-02-24 NOTE — Patient Instructions (Addendum)
Docusate Primera Semana - Tome 1 pastilla dos veces diaramente. Salomon MastSegunda Semana - Tome 1 pastilla diaramente  Tersera Semana - Tome 1 pastilla cada otro dia.     Obstruccin del intestino delgado (Small Bowel Obstruction) Una obstruccin del intestino delgado significa que hay algo que lo est obstruyendo. Esta porcin del intestino se llama intestino delgado. Es el tubo largo que conecta el estmago con el colon. Una obstruccin impedir el paso de los alimentos y los lquidos por el intestino delgado. El tratamiento depende de la causa del problema y de su gravedad. CUIDADOS EN EL HOGAR  Descanse lo suficiente.  Siga la dieta segn las indicaciones de su mdico. Puede ser necesario que: ? Beba solamente lquidos transparentes hasta que empiece a mejorar. ? Evite los alimentos slidos como se lo haya indicado el mdico.  Tome los medicamentos de venta libre y los recetados solamente como se lo haya indicado el mdico.  OceanographerConcurra a todas las visitas de control como se lo haya indicado el mdico. Esto es importante. SOLICITE AYUDA SI:  Tiene fiebre.  Tiene escalofros. SOLICITE AYUDA DE INMEDIATO SI:  Tiene dolor o clicos intestinales que empeoran.  Vomita sangre.  Tiene ganas de vomitar (nuseas) constantemente.  No puede dejar de vomitar.  No puede beber lquidos.  Se siente confundido.  Tiene sensacin de sequedad o est sediento (deshidratado).  El vientre est ms hinchado.  Se siente dbil o se desvanece (se desmaya). Esta informacin no tiene Theme park managercomo fin reemplazar el consejo del mdico. Asegrese de hacerle al mdico cualquier pregunta que tenga. Document Released: 07/04/2010 Document Revised: 11/02/2014 Document Reviewed: 04/07/2014 Elsevier Interactive Patient Education  2018 [employer]     IF you received an x-ray today, you will receive an invoice from [facility]. Please contact [facility] at [phone] with questions or concerns  regarding your invoice.   IF you received labwork today, you will receive an invoice from [facility]. Please contact [facility] at [phone] with questions or concerns regarding your invoice.   Our billing staff will not be able to assist you with questions regarding bills from these companies.  You will be contacted with the lab results as soon as they are available. The fastest way to get your results is to activate your My Chart account. Instructions are located on the last page of this paperwork. If you have not heard from us regarding the results in 2 weeks, please contact this office.

## 2017-05-05 ENCOUNTER — Ambulatory Visit: Payer: Self-pay

## 2017-05-05 NOTE — Telephone Encounter (Signed)
Patient called with c/o "blood in urine." She says "It started yesterday and it happened every time I urinate. I saw a doctor last week who ordered an ultrasound to check for kidney stones. I'm wearing a panty liner for blood leakage. I don't have pain with urination, no fever. I do have right flank pain at a 5. I don't take any medicine for the pain. I also have nausea." According to protocol, see PCP within 24 hours, appointment made for tomorrow at 1700 with Wallis BambergMario Mani, PA, care advice given, she verbalized understanding.   Reason for Disposition . Side (flank) or back pain present  Answer Assessment - Initial Assessment Questions 1. COLOR of URINE: "Describe the color of the urine."  (e.g., tea-colored, pink, red, blood clots, bloody)     Red 2. ONSET: "When did the bleeding start?"      Yesterday 3. EPISODES: "How many times has there been blood in the urine?" or "How many times today?"     Every time I urinate 4. PAIN with URINATION: "Is there any pain with passing your urine?" If so, ask: "How bad is the pain?"  (Scale 1-10; or mild, moderate, severe)    - MILD - complains slightly about urination hurting    - MODERATE - interferes with normal activities      - SEVERE - excruciating, unwilling or unable to urinate because of the pain      No 5. FEVER: "Do you have a fever?" If so, ask: "What is your temperature, how was it measured, and when did it start?"     No 6. ASSOCIATED SYMPTOMS: "Are you passing urine more frequently than usual?"     No 7. OTHER SYMPTOMS: "Do you have any other symptoms?" (e.g., back/flank pain, abdominal pain, vomiting)     Right flank pain 5-no medications; nausea 8. PREGNANCY: "Is there any chance you are pregnant?" "When was your last menstrual period?"     No  Protocols used: URINE - BLOOD IN-A-AH

## 2017-05-06 ENCOUNTER — Encounter: Payer: Self-pay | Admitting: Urgent Care

## 2017-05-06 ENCOUNTER — Ambulatory Visit: Payer: 59 | Admitting: Urgent Care

## 2017-05-06 ENCOUNTER — Ambulatory Visit (INDEPENDENT_AMBULATORY_CARE_PROVIDER_SITE_OTHER): Payer: 59

## 2017-05-06 VITALS — BP 136/79 | HR 88 | Temp 98.5°F | Resp 16 | Ht 62.0 in | Wt 161.6 lb

## 2017-05-06 DIAGNOSIS — R319 Hematuria, unspecified: Secondary | ICD-10-CM | POA: Diagnosis not present

## 2017-05-06 DIAGNOSIS — N200 Calculus of kidney: Secondary | ICD-10-CM

## 2017-05-06 DIAGNOSIS — M549 Dorsalgia, unspecified: Secondary | ICD-10-CM

## 2017-05-06 LAB — POCT URINALYSIS DIP (MANUAL ENTRY)
BILIRUBIN UA: NEGATIVE
GLUCOSE UA: NEGATIVE mg/dL
Ketones, POC UA: NEGATIVE mg/dL
NITRITE UA: NEGATIVE
PH UA: 6 (ref 5.0–8.0)
Protein Ur, POC: 30 mg/dL — AB
Spec Grav, UA: 1.015 (ref 1.010–1.025)
Urobilinogen, UA: 0.2 E.U./dL

## 2017-05-06 LAB — POC MICROSCOPIC URINALYSIS (UMFC): MUCUS RE: ABSENT

## 2017-05-06 LAB — POCT URINE PREGNANCY: Preg Test, Ur: NEGATIVE

## 2017-05-06 MED ORDER — SULFAMETHOXAZOLE-TRIMETHOPRIM 800-160 MG PO TABS: 1.0000 | ORAL_TABLET | Freq: Two times a day (BID) | ORAL | 0 refills | Status: DC

## 2017-05-06 NOTE — Progress Notes (Signed)
MRN: [id number] DOB: [birth year]  Subjective:   Erica [name] is a 37 y.o. female presenting for 1 week history of hematuria, cloudy urine, right-sided flank/back pain, urinary frequency, nausea without vomiting. States that she had a follow up appointment with her urologist 04/28/2017 and was told that she had improvement in her renal stones s/p lithotripsy. They did observe with renal U/S that she has multiple small stones and cautioned her that these have the capacity to cause obstruction. Denies fever, abdominal pain, dysuria. She has not tried any medications for relief.  [name] is not currently taking any medications and has No Known Allergies.  Erica [name]  has a past medical history of Anemia, Dysuria, History of kidney stones, Hypertension, and Renal calculus, right. Also  has a past surgical history that includes Laparoscopic bilateral salpingectomy (Bilateral, 08/12/2016); IR URETERAL STENT RIGHT NEW ACCESS W/O SEP NEPHROSTOMY CATH (12/31/2016); Nephrolithotomy (Right, 12/31/2016); Cholecystectomy (N/A, 09/10/2016); Cystoscopy/ureteroscopy/holmium laser/stent placement (Right, 01/10/2017); Holmium laser application (Right, 01/10/2017); Ureteroscopy with holmium laser lithotripsy (Right, 01/31/2017); and Holmium laser application (Right, 01/31/2017).  Objective:   Vitals: BP 136/79   Pulse 88   Temp 98.5 F (36.9 C) (Oral)   Resp 16   Ht 5\' 2"  (1.575 m)   Wt 161 lb 9.6 oz (73.3 kg)   SpO2 100%   BMI 29.56 kg/m   Physical Exam  Constitutional: She is oriented to person, place, and time. She appears well-developed and well-nourished.  HENT:  Mouth/Throat: Oropharynx is clear and moist.  Cardiovascular: Normal rate, regular rhythm and intact distal pulses. Exam reveals no gallop and no friction rub.  No murmur heard. Pulmonary/Chest: No respiratory distress. She has no wheezes. She has no rales.  Abdominal: Soft. Bowel sounds are normal. She exhibits no distension and no mass.  There is no tenderness.  Right CVA tenderness.  Musculoskeletal: She exhibits no edema.  Neurological: She is alert and oriented to person, place, and time.  Skin: Skin is warm and dry. No rash noted. No erythema. No pallor.  Psychiatric: She has a normal mood and affect.   Results for orders placed or performed in visit on 05/06/17 (from the past 24 hour(s))  POCT urinalysis dipstick     Status: Abnormal   Collection Time: 05/06/17  5:24 PM  Result Value Ref Range   Color, UA brown (A) yellow   Clarity, UA cloudy (A) clear   Glucose, UA negative negative mg/dL   Bilirubin, UA negative negative   Ketones, POC UA negative negative mg/dL   Spec Grav, UA 4.098 1.191 - 1.025   Blood, UA large (A) negative   pH, UA 6.0 5.0 - 8.0   Protein Ur, POC =30 (A) negative mg/dL   Urobilinogen, UA 0.2 0.2 or 1.0 E.U./dL   Nitrite, UA Negative Negative   Leukocytes, UA Large (3+) (A) Negative  POCT urine pregnancy     Status: None   Collection Time: 05/06/17  5:25 PM  Result Value Ref Range   Preg Test, Ur Negative Negative  POCT Microscopic Urinalysis ([facility])     Status: Abnormal   Collection Time: 05/06/17  5:33 PM  Result Value Ref Range   WBC,UR,HPF,POC Too numerous to count  (A) None WBC/hpf   RBC,UR,HPF,POC Too numerous to count  (A) None RBC/hpf   Bacteria Many (A) None, Too numerous to count   Mucus Absent Absent   Epithelial Cells, UR Per Microscopy Few (A) None, Too numerous to count cells/hpf   Dg Abd 1 View  Result Date: 05/06/2017 CLINICAL DATA:  History of hematuria and staghorn calculus with lithotripsy EXAM: ABDOMEN - 1 VIEW COMPARISON:  04/28/2017 FINDINGS: Scattered large and small bowel gas is noted. Multiple calcifications are noted over the lower pole of the right kidney. The overall appearance is similar to that seen on the prior exam but somewhat obscured by overlying fecal material. No definitive ureteral stones are noted. No bony abnormality is seen. IMPRESSION:  Scattered renal calculi over the lower pole of the right kidney similar to that noted on prior study. Electronically Signed   By: Alcide CleverMark  Lukens M.D.   On: 05/06/2017 18:04     Assessment and Plan :   Hematuria, unspecified type - Plan: POCT urinalysis dipstick, POCT Microscopic Urinalysis ([facility]), POCT urine pregnancy, Urine Culture, DG Abd 1 View  CVA tenderness - Plan: DG Abd 1 View  Renal stones - Plan: DG Abd 1 View  Staghorn renal calculus - Plan: DG Abd 1 View  Will manage as cystitis with Bactrim. Recheck with urologist. Hydrate aggressively. Return-to-clinic precautions discussed, patient verbalized understanding.   Wallis BambergMario Aiyanna Awtrey, PA-C [facility] [phone] 05/06/2017  5:40 PM

## 2017-05-06 NOTE — Patient Instructions (Addendum)
Kidney Stones Kidney stones (urolithiasis) are solid, rock-like deposits that form inside of the organs that make urine (kidneys). A kidney stone may form in a kidney and move into the bladder, where it can cause intense pain and block the flow of urine. Kidney stones are created when high levels of certain minerals are found in the urine. They are usually passed through urination, but in some cases, medical treatment may be needed to remove them. What are the causes? Kidney stones may be caused by:  A condition in which certain glands produce too much parathyroid hormone (primary hyperparathyroidism), which causes too much calcium buildup in the blood.  Buildup of uric acid crystals in the bladder (hyperuricosuria). Uric acid is a chemical that the body produces when you eat certain foods. It usually exits the body in the urine.  Narrowing (stricture) of one or both of the tubes that drain urine from the kidneys to the bladder (ureters).  A kidney blockage that is present at birth (congenital obstruction).  Past surgery on the kidney or the ureters, such as gastric bypass surgery.  What increases the risk? The following factors make you more likely to develop kidney stones:  Having had a kidney stone in the past.  Having a family history of kidney stones.  Not drinking enough water.  Eating a diet that is high in protein, salt (sodium), or sugar.  Being overweight or obese.  What are the signs or symptoms? Symptoms of a kidney stone may include:  Nausea.  Vomiting.  Blood in the urine (hematuria).  Pain in the side of the abdomen, right below the ribs (flank pain). Pain usually spreads (radiates) to the groin.  Needing to urinate frequently or urgently.  How is this diagnosed? This condition may be diagnosed based on:  Your medical history.  A physical exam.  Blood tests.  Urine tests.  CT scan.  Abdominal X-ray.  A procedure to examine the inside of the  bladder (cystoscopy).  How is this treated? Treatment for kidney stones depends on the size, location, and makeup of the stones. Treatment may involve:  Analyzing your urine before and after you pass the stone through urination.  Being monitored at the hospital until you pass the stone through urination.  Increasing your fluid intake and decreasing the amount of calcium and protein in your diet.  A procedure to break up kidney stones in the bladder using: ? A focused beam of light (laser therapy). ? Shock waves (extracorporeal shock wave lithotripsy).  Surgery to remove kidney stones. This may be needed if you have severe pain or have stones that block your urinary tract.  Follow these instructions at home: Eating and drinking   Drink enough fluid to keep your urine clear or pale yellow. This will help you to pass the kidney stone.  If directed, change your diet. This may include: ? Limiting how much sodium you eat. ? Eating more fruits and vegetables. ? Limiting how much meat, poultry, fish, and eggs you eat.  Follow instructions from your health care provider about eating or drinking restrictions. General instructions  Collect urine samples as told by your health care provider. You may need to collect a urine sample: ? 24 hours after you pass the stone. ? 8-12 weeks after passing the kidney stone, and every 6-12 months after that.  Strain your urine every time you urinate, for as long as directed. Use the strainer that your health care provider recommends.  Do not throw out   the kidney stone after passing it. Keep the stone so it can be tested by your health care provider. Testing the makeup of your kidney stone may help prevent you from getting kidney stones in the future.  Take over-the-counter and prescription medicines only as told by your health care provider.  Keep all follow-up visits as told by your health care provider. This is important. You may need follow-up  X-rays or ultrasounds to make sure that your stone has passed. How is this prevented? To prevent another kidney stone:  Drink enough fluid to keep your urine clear or pale yellow. This is the best way to prevent kidney stones.  Eat a healthy diet and follow recommendations from your health care provider about foods to avoid. You may be instructed to eat a low-protein diet. Recommendations vary depending on the type of kidney stone that you have.  Maintain a healthy weight.  Contact a health care provider if:  You have pain that gets worse or does not get better with medicine. Get help right away if:  You have a fever or chills.  You develop severe pain.  You develop new abdominal pain.  You faint.  You are unable to urinate. This information is not intended to replace advice given to you by your health care provider. Make sure you discuss any questions you have with your health care provider. Document Released: 02/11/2005 Document Revised: 09/01/2015 Document Reviewed: 07/28/2015 Elsevier Interactive Patient Education  2018 [employer].     Hematuria, Adult Hematuria is blood in your urine. It can be caused by a bladder infection, kidney infection, prostate infection, kidney stone, or cancer of your urinary tract. Infections can usually be treated with medicine, and a kidney stone usually will pass through your urine. If neither of these is the cause of your hematuria, further workup to find out the reason may be needed. It is very important that you tell your health care provider about any blood you see in your urine, even if the blood stops without treatment or happens without causing pain. Blood in your urine that happens and then stops and then happens again can be a symptom of a very serious condition. Also, pain is not a symptom in the initial stages of many urinary cancers. Follow these instructions at home:  Drink lots of fluid, 3-4 quarts a day. If you have been  diagnosed with an infection, cranberry juice is especially recommended, in addition to large amounts of water.  Avoid caffeine, tea, and carbonated beverages because they tend to irritate the bladder.  Avoid alcohol because it may irritate the prostate.  Take all medicines as directed by your health care provider.  If you were prescribed an antibiotic medicine, finish it all even if you start to feel better.  If you have been diagnosed with a kidney stone, follow your health care provider's instructions regarding straining your urine to catch the stone.  Empty your bladder often. Avoid holding urine for long periods of time.  After a bowel movement, women should cleanse front to back. Use each tissue only once.  Empty your bladder before and after sexual intercourse if you are a female. Contact a health care provider if:  You develop back pain.  You have a fever.  You have a feeling of sickness in your stomach (nausea) or vomiting.  Your symptoms are not better in 3 days. Return sooner if you are getting worse. Get help right away if:  You develop severe vomiting and  are unable to keep the medicine down.  You develop severe back or abdominal pain despite taking your medicines.  You begin passing a large amount of blood or clots in your urine.  You feel extremely weak or faint, or you pass out. This information is not intended to replace advice given to you by your health care provider. Make sure you discuss any questions you have with your health care provider. Document Released: 02/11/2005 Document Revised: 07/20/2015 Document Reviewed: 10/12/2012 Elsevier Interactive Patient Education  2017 [employer]     IF you received an x-ray today, you will receive an invoice from [facility]. Please contact [facility] at [phone] with questions or concerns regarding your invoice.   IF you received labwork today, you will receive an invoice from [facility].  Please contact [facility] at [phone] with questions or concerns regarding your invoice.   Our billing staff will not be able to assist you with questions regarding bills from these companies.  You will be contacted with the lab results as soon as they are available. The fastest way to get your results is to activate your My Chart account. Instructions are located on the last page of this paperwork. If you have not heard from us regarding the results in 2 weeks, please contact this office.

## 2017-05-08 LAB — URINE CULTURE

## 2017-10-17 ENCOUNTER — Other Ambulatory Visit: Payer: Self-pay | Admitting: Urology

## 2017-10-23 ENCOUNTER — Encounter (HOSPITAL_BASED_OUTPATIENT_CLINIC_OR_DEPARTMENT_OTHER): Payer: Self-pay | Admitting: *Deleted

## 2017-10-24 ENCOUNTER — Other Ambulatory Visit: Payer: Self-pay

## 2017-10-24 ENCOUNTER — Encounter (HOSPITAL_BASED_OUTPATIENT_CLINIC_OR_DEPARTMENT_OTHER): Payer: Self-pay | Admitting: *Deleted

## 2017-10-24 NOTE — Progress Notes (Signed)
Spoke w/ pt via phone for pre-op interview.  Npo after mn.  Arrive at [employer].  Needs hg (pt has anemia).  Current ekg in chart and epic.

## 2017-10-28 ENCOUNTER — Encounter (HOSPITAL_BASED_OUTPATIENT_CLINIC_OR_DEPARTMENT_OTHER): Payer: Self-pay | Admitting: Anesthesiology

## 2017-10-28 NOTE — Anesthesia Preprocedure Evaluation (Addendum)
Anesthesia Evaluation  Patient identified by MRN, date of birth, ID band Patient awake    Reviewed: Allergy & Precautions, NPO status , Patient's Chart, lab work & pertinent test results  Airway Mallampati: I       Dental no notable dental hx. (+) Teeth Intact   Pulmonary neg pulmonary ROS,    Pulmonary exam normal breath sounds clear to auscultation       Cardiovascular hypertension, Normal cardiovascular exam Rhythm:Regular Rate:Normal     Neuro/Psych negative neurological ROS  negative psych ROS   GI/Hepatic negative GI ROS, Neg liver ROS,   Endo/Other  negative endocrine ROS  Renal/GU Right renal stones  negative genitourinary   Musculoskeletal negative musculoskeletal ROS (+)   Abdominal Normal abdominal exam  (+)   Peds negative pediatric ROS (+)  Hematology negative hematology ROS (+)   Anesthesia Other Findings   Reproductive/Obstetrics negative OB ROS                           Anesthesia Physical  Anesthesia Plan  ASA: II  Anesthesia Plan: General   Post-op Pain Management:    Induction: Intravenous  PONV Risk Score and Plan: 3 and Ondansetron, Dexamethasone and Treatment may vary due to age or medical condition  Airway Management Planned: LMA  Additional Equipment:   Intra-op Plan:   Post-operative Plan: Extubation in OR  Informed Consent: I have reviewed the patients History and Physical, chart, labs and discussed the procedure including the risks, benefits and alternatives for the proposed anesthesia with the patient or authorized representative who has indicated his/her understanding and acceptance.     Plan Discussed with: CRNA and Surgeon  Anesthesia Plan Comments:        Anesthesia Quick Evaluation

## 2017-10-29 ENCOUNTER — Ambulatory Visit (HOSPITAL_BASED_OUTPATIENT_CLINIC_OR_DEPARTMENT_OTHER): Payer: Self-pay | Admitting: Anesthesiology

## 2017-10-29 ENCOUNTER — Other Ambulatory Visit: Payer: Self-pay

## 2017-10-29 ENCOUNTER — Encounter (HOSPITAL_BASED_OUTPATIENT_CLINIC_OR_DEPARTMENT_OTHER)
Admission: RE | Disposition: A | Discharge status: Home / Self Care | Payer: Self-pay | Source: Ambulatory Visit | Attending: Urology

## 2017-10-29 ENCOUNTER — Ambulatory Visit (HOSPITAL_BASED_OUTPATIENT_CLINIC_OR_DEPARTMENT_OTHER)
Admission: RE | Admit: 2017-10-29 | Discharge: 2017-10-29 | Disposition: A | Discharge status: Home / Self Care | Payer: Self-pay | Source: Ambulatory Visit | Attending: Urology | Admitting: Urology

## 2017-10-29 ENCOUNTER — Encounter (HOSPITAL_BASED_OUTPATIENT_CLINIC_OR_DEPARTMENT_OTHER): Payer: Self-pay

## 2017-10-29 DIAGNOSIS — Z87440 Personal history of urinary (tract) infections: Secondary | ICD-10-CM | POA: Insufficient documentation

## 2017-10-29 DIAGNOSIS — D649 Anemia, unspecified: Secondary | ICD-10-CM | POA: Insufficient documentation

## 2017-10-29 DIAGNOSIS — N132 Hydronephrosis with renal and ureteral calculous obstruction: Secondary | ICD-10-CM | POA: Insufficient documentation

## 2017-10-29 DIAGNOSIS — I10 Essential (primary) hypertension: Secondary | ICD-10-CM | POA: Insufficient documentation

## 2017-10-29 DIAGNOSIS — Q6211 Congenital occlusion of ureteropelvic junction: Secondary | ICD-10-CM | POA: Insufficient documentation

## 2017-10-29 DIAGNOSIS — Z79899 Other long term (current) drug therapy: Secondary | ICD-10-CM | POA: Insufficient documentation

## 2017-10-29 HISTORY — DX: Presence of spectacles and contact lenses: Z97.3

## 2017-10-29 HISTORY — PX: CYSTOSCOPY/URETEROSCOPY/HOLMIUM LASER/STENT PLACEMENT: SHX6546

## 2017-10-29 HISTORY — DX: Calculus of kidney: N20.0

## 2017-10-29 LAB — BASIC METABOLIC PANEL
Anion gap: 9 (ref 5–15)
BUN: 11 mg/dL (ref 6–20)
CO2: 21 mmol/L — AB (ref 22–32)
CREATININE: 0.73 mg/dL (ref 0.44–1.00)
Calcium: 9.1 mg/dL (ref 8.9–10.3)
Chloride: 109 mmol/L (ref 98–111)
GFR calc Af Amer: >60 mL/min (ref >60)
GFR calc non Af Amer: >60 mL/min (ref >60)
Glucose, Bld: 100 mg/dL — ABNORMAL HIGH (ref 70–99)
Potassium: 4.1 mmol/L (ref 3.5–5.1)
Sodium: 139 mmol/L (ref 135–145)

## 2017-10-29 LAB — HEMOGLOBIN: Hemoglobin: 12.5 g/dL (ref 12.0–15.0)

## 2017-10-29 SURGERY — CYSTOSCOPY/URETEROSCOPY/HOLMIUM LASER/STENT PLACEMENT
Anesthesia: General | Laterality: Right

## 2017-10-29 MED ORDER — ACETAMINOPHEN 325 MG PO TABS
325.0000 mg | ORAL_TABLET | ORAL | Status: DC | PRN
  Filled 2017-10-29: qty 2

## 2017-10-29 MED ORDER — DEXAMETHASONE SODIUM PHOSPHATE 10 MG/ML IJ SOLN
INTRAMUSCULAR | Status: DC | PRN
  Administered 2017-10-29: 10 mg via INTRAVENOUS

## 2017-10-29 MED ORDER — PHENAZOPYRIDINE HCL 200 MG PO TABS
200.0000 mg | ORAL_TABLET | Freq: Three times a day (TID) | ORAL | Status: DC
  Administered 2017-10-29: 200 mg via ORAL
  Filled 2017-10-29: qty 1

## 2017-10-29 MED ORDER — FENTANYL CITRATE (PF) 100 MCG/2ML IJ SOLN
25.0000 ug | INTRAMUSCULAR | Status: DC | PRN
  Filled 2017-10-29: qty 1

## 2017-10-29 MED ORDER — PROPOFOL 10 MG/ML IV BOLUS
INTRAVENOUS | Status: AC
  Filled 2017-10-29: qty 40

## 2017-10-29 MED ORDER — SULFAMETHOXAZOLE-TRIMETHOPRIM 800-160 MG PO TABS: 1.0000 | ORAL_TABLET | Freq: Two times a day (BID) | ORAL | 0 refills | Status: AC

## 2017-10-29 MED ORDER — HYDROCODONE-ACETAMINOPHEN 5-325 MG PO TABS: 1.0000 | ORAL_TABLET | ORAL | 0 refills | Status: AC | PRN

## 2017-10-29 MED ORDER — MIDAZOLAM HCL 2 MG/2ML IJ SOLN
INTRAMUSCULAR | Status: AC
  Filled 2017-10-29: qty 2

## 2017-10-29 MED ORDER — PHENAZOPYRIDINE HCL 100 MG PO TABS
ORAL_TABLET | ORAL | Status: AC
  Filled 2017-10-29: qty 2

## 2017-10-29 MED ORDER — LIDOCAINE 2% (20 MG/ML) 5 ML SYRINGE
INTRAMUSCULAR | Status: AC
  Filled 2017-10-29: qty 5

## 2017-10-29 MED ORDER — OXYCODONE HCL 5 MG PO TABS
5.0000 mg | ORAL_TABLET | Freq: Once | ORAL | Status: DC | PRN
  Filled 2017-10-29: qty 1

## 2017-10-29 MED ORDER — OXYBUTYNIN CHLORIDE 5 MG PO TABS
ORAL_TABLET | ORAL | Status: AC
  Filled 2017-10-29: qty 1

## 2017-10-29 MED ORDER — ARTIFICIAL TEARS OPHTHALMIC OINT
TOPICAL_OINTMENT | OPHTHALMIC | Status: AC
  Filled 2017-10-29: qty 3.5

## 2017-10-29 MED ORDER — PROPOFOL 10 MG/ML IV BOLUS
INTRAVENOUS | Status: DC | PRN
  Administered 2017-10-29: 120 mg via INTRAVENOUS

## 2017-10-29 MED ORDER — ONDANSETRON HCL 4 MG/2ML IJ SOLN
INTRAMUSCULAR | Status: DC | PRN
  Administered 2017-10-29: 4 mg via INTRAVENOUS

## 2017-10-29 MED ORDER — KETOROLAC TROMETHAMINE 30 MG/ML IJ SOLN
INTRAMUSCULAR | Status: DC | PRN
  Administered 2017-10-29: 30 mg via INTRAVENOUS

## 2017-10-29 MED ORDER — OXYBUTYNIN CHLORIDE 5 MG PO TABS
5.0000 mg | ORAL_TABLET | Freq: Three times a day (TID) | ORAL | Status: DC
  Administered 2017-10-29: 5 mg via ORAL
  Filled 2017-10-29: qty 1

## 2017-10-29 MED ORDER — IOHEXOL 300 MG/ML  SOLN
INTRAMUSCULAR | Status: DC | PRN
  Administered 2017-10-29: 30 mL via URETHRAL

## 2017-10-29 MED ORDER — ACETAMINOPHEN 160 MG/5ML PO SOLN
325.0000 mg | ORAL | Status: DC | PRN
  Filled 2017-10-29: qty 20.3

## 2017-10-29 MED ORDER — KETOROLAC TROMETHAMINE 30 MG/ML IJ SOLN
30.0000 mg | Freq: Once | INTRAMUSCULAR | Status: DC | PRN
  Filled 2017-10-29: qty 1

## 2017-10-29 MED ORDER — MIDAZOLAM HCL 2 MG/2ML IJ SOLN
INTRAMUSCULAR | Status: DC | PRN
  Administered 2017-10-29 (×2): 1 mg via INTRAVENOUS

## 2017-10-29 MED ORDER — OXYBUTYNIN CHLORIDE 5 MG PO TABS: 5.0000 mg | ORAL_TABLET | Freq: Three times a day (TID) | ORAL | 1 refills | Status: AC | PRN

## 2017-10-29 MED ORDER — LACTATED RINGERS IV SOLN
INTRAVENOUS | Status: DC
  Administered 2017-10-29: 1000 mL via INTRAVENOUS
  Filled 2017-10-29: qty 1000

## 2017-10-29 MED ORDER — LIDOCAINE 2% (20 MG/ML) 5 ML SYRINGE
INTRAMUSCULAR | Status: DC | PRN
  Administered 2017-10-29: 100 mg via INTRAVENOUS

## 2017-10-29 MED ORDER — ONDANSETRON HCL 4 MG/2ML IJ SOLN
4.0000 mg | Freq: Once | INTRAMUSCULAR | Status: DC | PRN
  Filled 2017-10-29: qty 2

## 2017-10-29 MED ORDER — FENTANYL CITRATE (PF) 100 MCG/2ML IJ SOLN
INTRAMUSCULAR | Status: AC
  Filled 2017-10-29: qty 2

## 2017-10-29 MED ORDER — PHENAZOPYRIDINE HCL 200 MG PO TABS: 200.0000 mg | ORAL_TABLET | Freq: Three times a day (TID) | ORAL | 0 refills | Status: AC | PRN

## 2017-10-29 MED ORDER — DEXAMETHASONE SODIUM PHOSPHATE 10 MG/ML IJ SOLN
INTRAMUSCULAR | Status: AC
  Filled 2017-10-29: qty 1

## 2017-10-29 MED ORDER — FENTANYL CITRATE (PF) 100 MCG/2ML IJ SOLN
INTRAMUSCULAR | Status: DC | PRN
  Administered 2017-10-29 (×2): 50 ug via INTRAVENOUS

## 2017-10-29 MED ORDER — KETOROLAC TROMETHAMINE 30 MG/ML IJ SOLN
INTRAMUSCULAR | Status: AC
  Filled 2017-10-29: qty 1

## 2017-10-29 MED ORDER — OXYCODONE HCL 5 MG/5ML PO SOLN
5.0000 mg | Freq: Once | ORAL | Status: DC | PRN
  Filled 2017-10-29: qty 5

## 2017-10-29 MED ORDER — GENTAMICIN SULFATE 40 MG/ML IJ SOLN
5.0000 mg/kg | Freq: Once | INTRAVENOUS | Status: AC
  Administered 2017-10-29: 280 mg via INTRAVENOUS
  Filled 2017-10-29: qty 7

## 2017-10-29 MED ORDER — GENTAMICIN SULFATE 40 MG/ML IJ SOLN
5.0000 mg/kg | Freq: Once | INTRAVENOUS | Status: DC
  Filled 2017-10-29: qty 9.25

## 2017-10-29 MED ORDER — ONDANSETRON HCL 4 MG/2ML IJ SOLN
INTRAMUSCULAR | Status: AC
  Filled 2017-10-29: qty 2

## 2017-10-29 MED ORDER — MEPERIDINE HCL 25 MG/ML IJ SOLN
6.2500 mg | INTRAMUSCULAR | Status: DC | PRN
  Filled 2017-10-29: qty 1

## 2017-10-29 SURGICAL SUPPLY — 31 items
APL SKNCLS STERI-STRIP NONHPOA (GAUZE/BANDAGES/DRESSINGS)
BAG DRAIN URO-CYSTO SKYTR STRL (DRAIN) ×3 IMPLANT
BAG DRN UROCATH (DRAIN) ×1
BASKET STONE 1.7 NGAGE (UROLOGICAL SUPPLIES) ×2 IMPLANT
BASKET ZERO TIP NITINOL 2.4FR (BASKET) ×3 IMPLANT
BENZOIN TINCTURE PRP APPL 2/3 (GAUZE/BANDAGES/DRESSINGS) IMPLANT
BSKT STON RTRVL ZERO TP 2.4FR (BASKET) ×1
CATH URET 5FR 28IN OPEN ENDED (CATHETERS) IMPLANT
CLOSURE WOUND 1/2 X4 (GAUZE/BANDAGES/DRESSINGS)
CLOTH BEACON ORANGE TIMEOUT ST (SAFETY) ×3 IMPLANT
FIBER LASER FLEXIVA 365 (UROLOGICAL SUPPLIES) IMPLANT
FIBER LASER TRAC TIP (UROLOGICAL SUPPLIES) ×2 IMPLANT
GLOVE BIO SURGEON STRL SZ7.5 (GLOVE) ×3 IMPLANT
GOWN STRL REUS W/TWL XL LVL3 (GOWN DISPOSABLE) ×3 IMPLANT
GUIDEWIRE STR DUAL SENSOR (WIRE) IMPLANT
GUIDEWIRE ZIPWRE .038 STRAIGHT (WIRE) ×5 IMPLANT
INFUSOR MANOMETER BAG 3000ML (MISCELLANEOUS) ×1 IMPLANT
IV NS 1000ML (IV SOLUTION)
IV NS 1000ML BAXH (IV SOLUTION) IMPLANT
IV NS IRRIG 3000ML ARTHROMATIC (IV SOLUTION) ×3 IMPLANT
KIT TURNOVER CYSTO (KITS) ×3 IMPLANT
MANIFOLD NEPTUNE II (INSTRUMENTS) ×3 IMPLANT
NS IRRIG 500ML POUR BTL (IV SOLUTION) ×6 IMPLANT
PACK CYSTO (CUSTOM PROCEDURE TRAY) ×3 IMPLANT
SHEATH URETERAL 12FRX35CM (MISCELLANEOUS) ×2 IMPLANT
STENT URET 6FRX24 CONTOUR (STENTS) ×2 IMPLANT
STRIP CLOSURE SKIN 1/2X4 (GAUZE/BANDAGES/DRESSINGS) IMPLANT
SYR 10ML LL (SYRINGE) ×3 IMPLANT
TUBE CONNECTING 12'X1/4 (SUCTIONS) ×1
TUBE CONNECTING 12X1/4 (SUCTIONS) ×1 IMPLANT
TUBING UROLOGY SET (TUBING) ×2 IMPLANT

## 2017-10-29 NOTE — Anesthesia Postprocedure Evaluation (Signed)
Anesthesia Post Note  Patient: Erica [name]  Procedure(s) Performed: CYSTOSCOPY/ RETROGRADE/URETEROSCOPY/HOLMIUM LASER/STENT PLACEMENT, STONE BASKETTING (Right )     Patient location during evaluation: PACU Anesthesia Type: General Level of consciousness: awake Pain management: pain level controlled Vital Signs Assessment: post-procedure vital signs reviewed and stable Respiratory status: spontaneous breathing Cardiovascular status: stable Postop Assessment: no apparent nausea or vomiting Anesthetic complications: no    Last Vitals:  Vitals:   10/29/17 1000 10/29/17 1015  BP: 122/73 104/70  Pulse: 80 76  Resp: 12 13  Temp:  36.6 C  SpO2: 97% 100%    Last Pain:  Vitals:   10/29/17 1015  TempSrc:   PainSc: 0-No pain   Pain Goal: Patients Stated Pain Goal: 4 (10/29/17 0649)               Mitsuye Schrodt JR,[name]

## 2017-10-29 NOTE — Transfer of Care (Signed)
Immediate Anesthesia Transfer of Care Note  Patient: Erica [name]  Procedure(s) Performed: CYSTOSCOPY/ RETROGRADE/URETEROSCOPY/HOLMIUM LASER/STENT PLACEMENT, STONE BASKETTING (Right )  Patient Location: PACU  Anesthesia Type:General  Level of Consciousness: awake, alert , oriented and patient cooperative  Airway & Oxygen Therapy: Patient Spontanous Breathing and Patient connected to nasal cannula oxygen  Post-op Assessment: Report given to RN and Post -op Vital signs reviewed and stable  Post vital signs: Reviewed and stable  Last Vitals:  Vitals Value Taken Time  BP 117/68 10/29/2017  9:46 AM  Temp    Pulse 86 10/29/2017  9:48 AM  Resp 12 10/29/2017  9:48 AM  SpO2 100 % 10/29/2017  9:48 AM  Vitals shown include unvalidated device data.  Last Pain:  Vitals:   10/29/17 0946  TempSrc:   PainSc: (P) 0-No pain      Patients Stated Pain Goal: 4 (10/29/17 6060)  Complications: No apparent anesthesia complications

## 2017-10-29 NOTE — Op Note (Signed)
Operative Note  Preoperative diagnosis:  1.  Multiple right renal stones 2.  Stenotic right ureteropelvic junction 3.  Chronic right hydronephrosis 4.  Recurrent urinary tract infections 5.  History of right staghorn calculus  Postoperative diagnosis: 1.  Multiple right renal stones 2.  Stenotic right ureteropelvic junction 3.  Chronic right hydronephrosis 4.  Recurrent urinary tract infections 5.  History of right staghorn calculus  Procedure(s): 1.  Cystoscopy 2.  Right retrograde pyelogram with intraoperative interpretation of fluoroscopic imaging 3.  Right ureteroscopy 4.  Holmium laser lithotripsy 5.  Right JJ stent placement  Surgeon: Rhoderick Moody, MD  Assistants:  None  Anesthesia:  General  Complications:  None  EBL: Less than 5 mL  Specimens: 1.  Right renal stones  Drains/Catheters: 1.  Right 6 French by 24 cm JJ stent without tether  Intraoperative findings:   1. Right retrograde pyelogram revealed a stenotic area of the right UPJ measuring approximately 1 cm in length.  The stenotic area could easily accommodate the flexible ureteroscope, but the 14 French access sheath could not bypass the area of stenosis.  There were no other filling defects seen within the lumen of the right ureter.  The right renal pelvis shows chronic dilation, especially involving the upper pole calyces. 2. Multiple 5 mm right mid and lower pole renal stones   Indication:  Erica [name] is a 37 y.o. female with a history of a right staghorn calculus and is status post right PCNL in November 2018.  Since then, she has required multiple ureteroscopy's to try and clear her of her large right-sided stone burden.  Recently, the patient was seen in the office after she was found to have gross hematuria and intermittent episodes of right-sided flank pain.  KUB and renal ultrasound obtained from the office revealed a possible calcification in the area of the right UPJ as well as  chronic right-sided hydronephrosis, respectively.  She has been consented for the above procedures, voices understanding wishes to proceed.  Description of procedure:  After informed consent was obtained, the patient was brought to the operating room and general LMA anesthesia was administered. The patient was then placed in the dorsolithotomy position and prepped and draped in usual sterile fashion. A timeout was performed. A 23 French rigid cystoscope was then inserted into the urethral meatus and advanced into the bladder under direct vision. A complete bladder survey revealed no intravesical pathology.  A 5 French open-ended ureteral catheter was then inserted into the right ureteral orifice and a right retrograde pyelogram was obtained, with the findings listed above.  A Glidewire was advanced through the lumen of the ureteral catheter and up to the right renal pelvis, under fluoroscopic guidance.  An additional safety wire was advanced in a similar fashion.  The rigid cystoscope was then removed.  A 14 French ureteral access sheath was then advanced over the safety wire and into position within the proximal aspects of the right ureter.  A flexible ureteroscope was then advanced through the access sheath and into the right renal pelvis.  The area of stenosis noted on a right retrograde pyelogram measured approximately 1 cm in length and could easily accommodate the flexible ureteroscope.  The renal pelvis was inspected and she was found to have multiple 5 mm stones in the right mid and lower pole calyces.  The upper pole calyces were moderately to severely dilated.  A 200 m laser was then used to fracture the stones into smaller pieces.  The largest of these pieces were removed with an engage basket and sent to pathology for analysis.  The remaining stone fragments were too small to grasp with the basket so I dusted the stones with a holmium laser.    The ureteral access sheath was then retracted under  direct vision, revealing no residual stone within the lumen of the ureter or undue ureteral trauma.  The rigid cystoscope was then replaced within the bladder over the wire and a 6 [place] by 24 cm JJ stent was placed over the wire and into position within the right collecting system, confirming placement via fluoroscopy.  The patient's bladder was drained.  She tolerated the procedure well and was transferred to the postanesthesia in stable condition.  Plan: Patient will follow-up in 1 week for office cystoscopy and stent removal.  I will eventually need to get a MAG3 renal scan to assess her right renal function

## 2017-10-29 NOTE — Anesthesia Procedure Notes (Signed)
Procedure Name: LMA Insertion Date/Time: 10/29/2017 8:26 AM Performed by: Tyrone Nine, CRNA Pre-anesthesia Checklist: Patient identified, Timeout performed, Emergency Drugs available, Patient being monitored and Suction available Patient Re-evaluated:Patient Re-evaluated prior to induction Oxygen Delivery Method: Circle system utilized Preoxygenation: Pre-oxygenation with 100% oxygen Induction Type: IV induction Ventilation: Mask ventilation without difficulty LMA: LMA inserted LMA Size: 4.0 Number of attempts: 1 Placement Confirmation: breath sounds checked- equal and bilateral,  CO2 detector and positive ETCO2 Tube secured with: Tape Dental Injury: Teeth and Oropharynx as per pre-operative assessment

## 2017-10-29 NOTE — H&P (Signed)
Urology Preoperative H&P   Chief Complaint: Right flank pain  History of Present Illness: Erica [name] is a 37 y.o. female with a history of a large right staghorn calculus that has required a right PCNL and subsequent right URS x2.   Erica [name] presents today for scheduled follow-up. She states that her right-sided flank pain has improved since her last visit, but she will still have intermittent episodes of dull right-sided flank pain and will also occasionally have small amounts of gross hematuria associated with it. She denies nausea/vomiting or fever/chills. She also denies interval stone passage. UA today again shows signs of a urinary tract infection. She has a history of Escherichia coli ESBL UTIs. From a urinary standpoint, she states she has mild dysuria, but has a good force of stream and feels like she is emptying her bladder well.   She had a KUB and RUS on 10/09/17 that showed residual stone burden involving the right kidney with a possible stone at the right UPJ as well as chronic right hydronephrosis, respectively.      Past Medical History:  Diagnosis Date  . Anemia   . History of kidney stones   . Hypertension    borderline at [facility] referred to PCP but patient did not follow up, no meds  . Renal calculi    right side  . Wears glasses     Past Surgical History:  Procedure Laterality Date  . CHOLECYSTECTOMY N/A 09/10/2016   Procedure: LAPAROSCOPIC CHOLECYSTECTOMY WITH INTRAOPERATIVE CHOLANGIOGRAM;  Surgeon: Darnell Level, MD;  Location: [facility] OR;  Service: General;  Laterality: N/A;  . CYSTOSCOPY/URETEROSCOPY/HOLMIUM LASER/STENT PLACEMENT Right 01/10/2017   Procedure: CYSTOSCOPY/URETEROSCOPY Manning Charity LASER/STENT REPLACEMENT;  Surgeon: Rene Paci, MD;  Location: [facility];  Service: Urology;  Laterality: Right;  . HOLMIUM LASER APPLICATION Right 01/10/2017   Procedure: HOLMIUM LASER APPLICATION;  Surgeon: Rene Paci, MD;   Location: [facility];  Service: Urology;  Laterality: Right;  . HOLMIUM LASER APPLICATION Right 01/31/2017   Procedure: HOLMIUM LASER APPLICATION;  Surgeon: Rene Paci, MD;  Location: [facility];  Service: Urology;  Laterality: Right;  . IR URETERAL STENT RIGHT NEW ACCESS W/O SEP NEPHROSTOMY CATH  12/31/2016  . LAPAROSCOPIC BILATERAL SALPINGECTOMY Bilateral 08/12/2016   Procedure: LAPAROSCOPIC BILATERAL SALPINGECTOMY;  Surgeon: Reva Bores, MD;  Location: [facility] ORS;  Service: Gynecology;  Laterality: Bilateral;  . NEPHROLITHOTOMY Right 12/31/2016   Procedure: NEPHROLITHOTOMY PERCUTANEOUS;  Surgeon: Rene Paci, MD;  Location: [facility] ORS;  Service: Urology;  Laterality: Right;  . URETEROSCOPY WITH HOLMIUM LASER LITHOTRIPSY Right 01/31/2017   Procedure: CYSTOSCOPY, URETEROSCOPY WITH HOLMIUM LASER / LITHOTRIPSY, STONE BASKETRY / STENT REPLACEMENT;  Surgeon: Rene Paci, MD;  Location: [facility];  Service: Urology;  Laterality: Right;    Allergies: No Known Allergies  Family History  Problem Relation Age of Onset  . High Cholesterol Mother     Social History:  reports that she has never smoked. She has never used smokeless tobacco. She reports that she does not drink alcohol or use drugs.  ROS: A complete review of systems was performed.  All systems are negative except for pertinent findings as noted.  Physical Exam:  Vital signs in last 24 hours: Temp:  [98.5 F (36.9 C)] 98.5 F (36.9 C) (09/04 0635) Pulse Rate:  [79] 79 (09/04 0635) Resp:  [18] 18 (09/04 0635) BP: (121)/(80) 121/80 (09/04 0635) SpO2:  [100 %] 100 % (09/04 2202) Weight:  [  66.2 kg] 66.2 kg (09/04 1610) Constitutional:  Alert and oriented, No acute distress Cardiovascular: Regular rate and rhythm, No JVD Respiratory: Normal respiratory effort, Lungs clear bilaterally GI: Abdomen is soft, nontender, nondistended, no abdominal  masses GU: No CVA tenderness Lymphatic: No lymphadenopathy Neurologic: Grossly intact, no focal deficits Psychiatric: Normal mood and affect  Laboratory Data:  No results for input(s): WBC, HGB, HCT, PLT in the last 72 hours.  No results for input(s): NA, K, CL, GLUCOSE, BUN, CALCIUM, CREATININE in the last 72 hours.  Invalid input(s): CO3   No results found for this or any previous visit (from the past 24 hour(s)). No results found for this or any previous visit (from the past 240 hour(s)).  Renal Function: No results for input(s): CREATININE in the last 168 hours. CrCl cannot be calculated (Patient's most recent lab result is older than the maximum 21 days allowed.).  Radiologic Imaging: No results found.  I independently reviewed the above imaging studies.  Assessment and Plan Erica [name] is a 37 y.o. female with right renal and ureteral stones, chronic right hydronephrosis and a history of a right sided staghorn stone  The risks, benefits and alternatives of cystoscopy with RIGHT ureteroscopy, laser lithotripsy and ureteral stent placement was discussed the patient. Risks included, but are not limited to: bleeding, urinary tract infection, ureteral injury/avulsion, ureteral stricture formation, retained stone fragments, the possibility that multiple surgeries may be required to treat the stone(s), MI, stroke, PE and the inherent risks of general anesthesia. The patient voices understanding and wishes to proceed.    Rhoderick Moody, MD 10/29/2017, 6:51 AM  [facility] Pager: [phone]

## 2017-10-29 NOTE — Discharge Instructions (Signed)
°  Post Anesthesia Home Care Instructions  Activity: Get plenty of rest for the remainder of the day. A responsible individual must stay with you for 24 hours following the procedure.  For the next 24 hours, DO NOT: -Drive a car -Advertising copywriter -Drink alcoholic beverages -Take any medication unless instructed by your physician -Make any legal decisions or sign important papers.  Meals: Start with liquid foods such as gelatin or soup. Progress to regular foods as tolerated. Avoid greasy, spicy, heavy foods. If nausea and/or vomiting occur, drink only clear liquids until the nausea and/or vomiting subsides. Call your physician if vomiting continues.  Special Instructions/Symptoms: Your throat may feel dry or sore from the anesthesia or the breathing tube placed in your throat during surgery. If this causes discomfort, gargle with warm salt water. The discomfort should disappear within 24 hours.  If you had a scopolamine patch placed behind your ear for the management of post- operative nausea and/or vomiting:  1. The medication in the patch is effective for 72 hours, after which it should be removed.  Wrap patch in a tissue and discard in the trash. Wash hands thoroughly with soap and water. 2. You may remove the patch earlier than 72 hours if you experience unpleasant side effects which may include dry mouth, dizziness or visual disturbances. 3. Avoid touching the patch. Wash your hands with soap and water after contact with the patch.    NO MOTRIN, ADVIL, ALEVE, IBUPROFEN UNTIL 3:30 TODAY

## 2017-10-30 ENCOUNTER — Encounter (HOSPITAL_BASED_OUTPATIENT_CLINIC_OR_DEPARTMENT_OTHER): Payer: Self-pay | Admitting: Urology

## 2017-11-06 ENCOUNTER — Other Ambulatory Visit (HOSPITAL_COMMUNITY): Payer: Self-pay | Admitting: Urology

## 2017-11-06 DIAGNOSIS — N132 Hydronephrosis with renal and ureteral calculous obstruction: Secondary | ICD-10-CM

## 2018-01-13 ENCOUNTER — Encounter (HOSPITAL_COMMUNITY): Payer: 59

## 2018-01-23 ENCOUNTER — Ambulatory Visit (HOSPITAL_COMMUNITY): Admission: RE | Admit: 2018-01-23 | Payer: 59 | Source: Ambulatory Visit

## 2019-08-28 IMAGING — DX DG ABDOMEN 1V
2 series · 2 of 2 positions shown · non-contrast
Comparison: Multiple priors.

CLINICAL DATA: RIGHT flank pain.

EXAM:
ABDOMEN - 1 VIEW

[abdomen kub (1 of 2)]
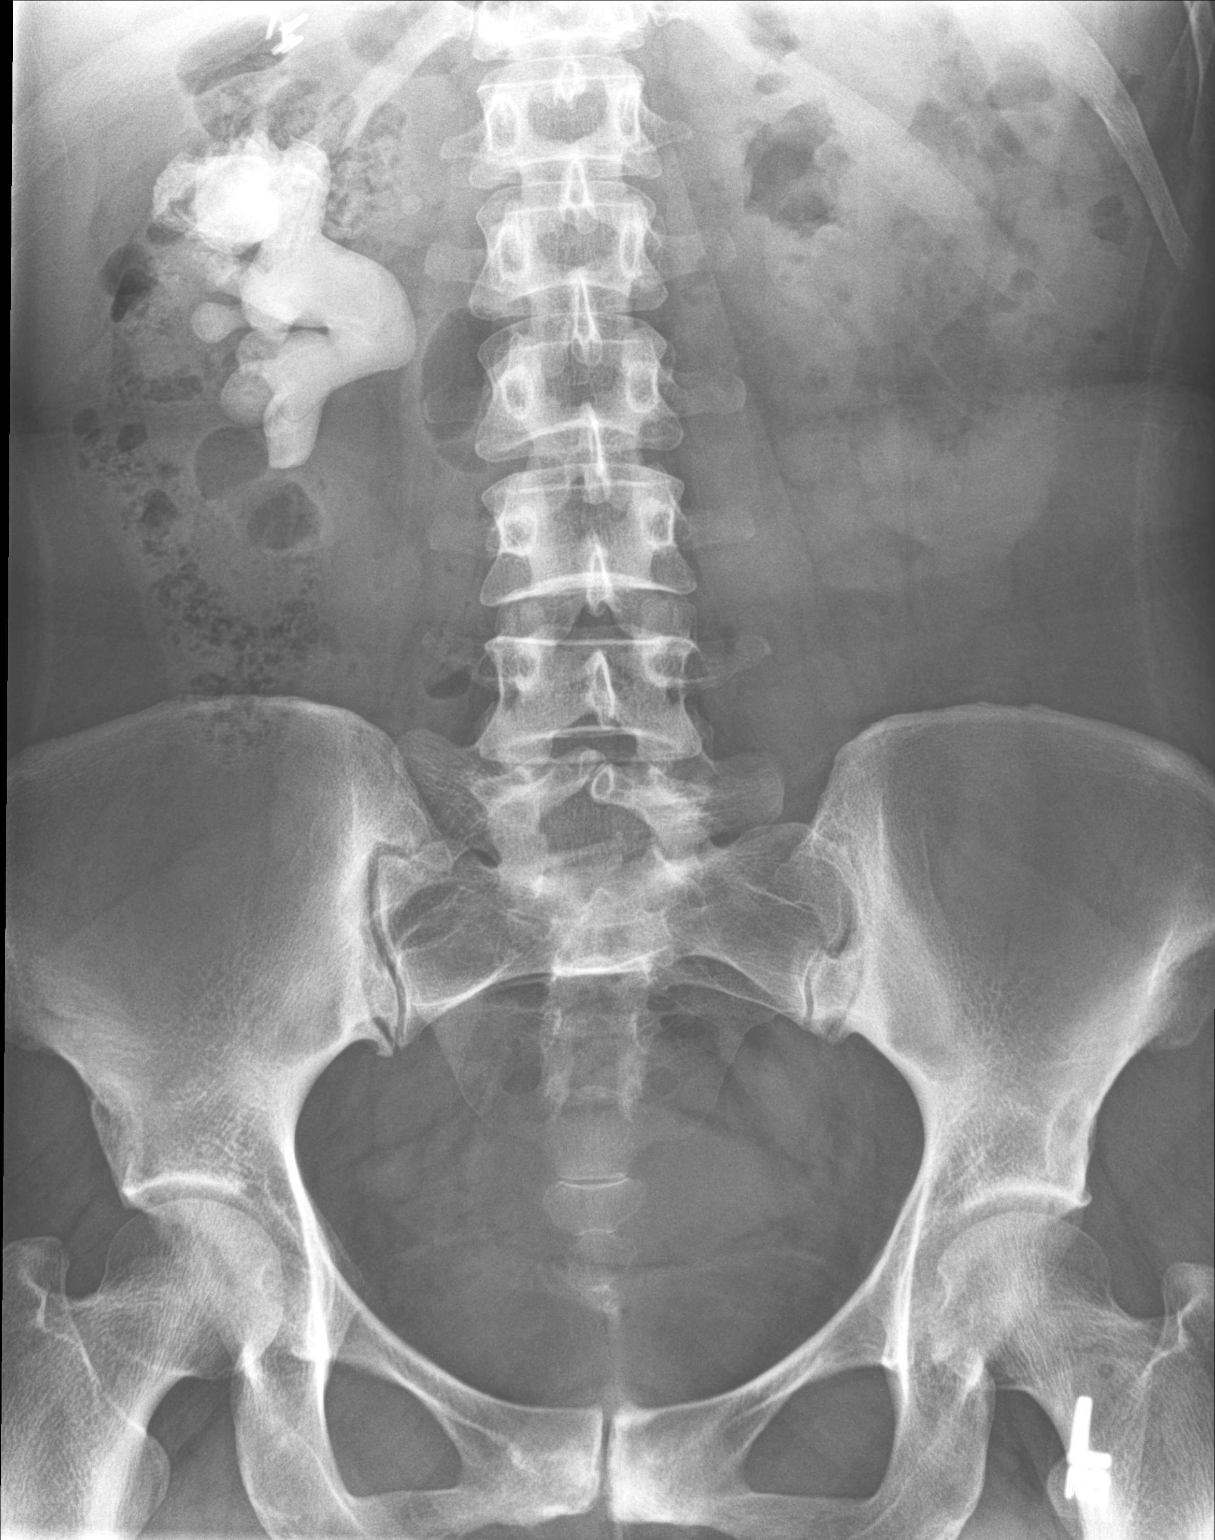

[abdomen kub (2 of 2)]
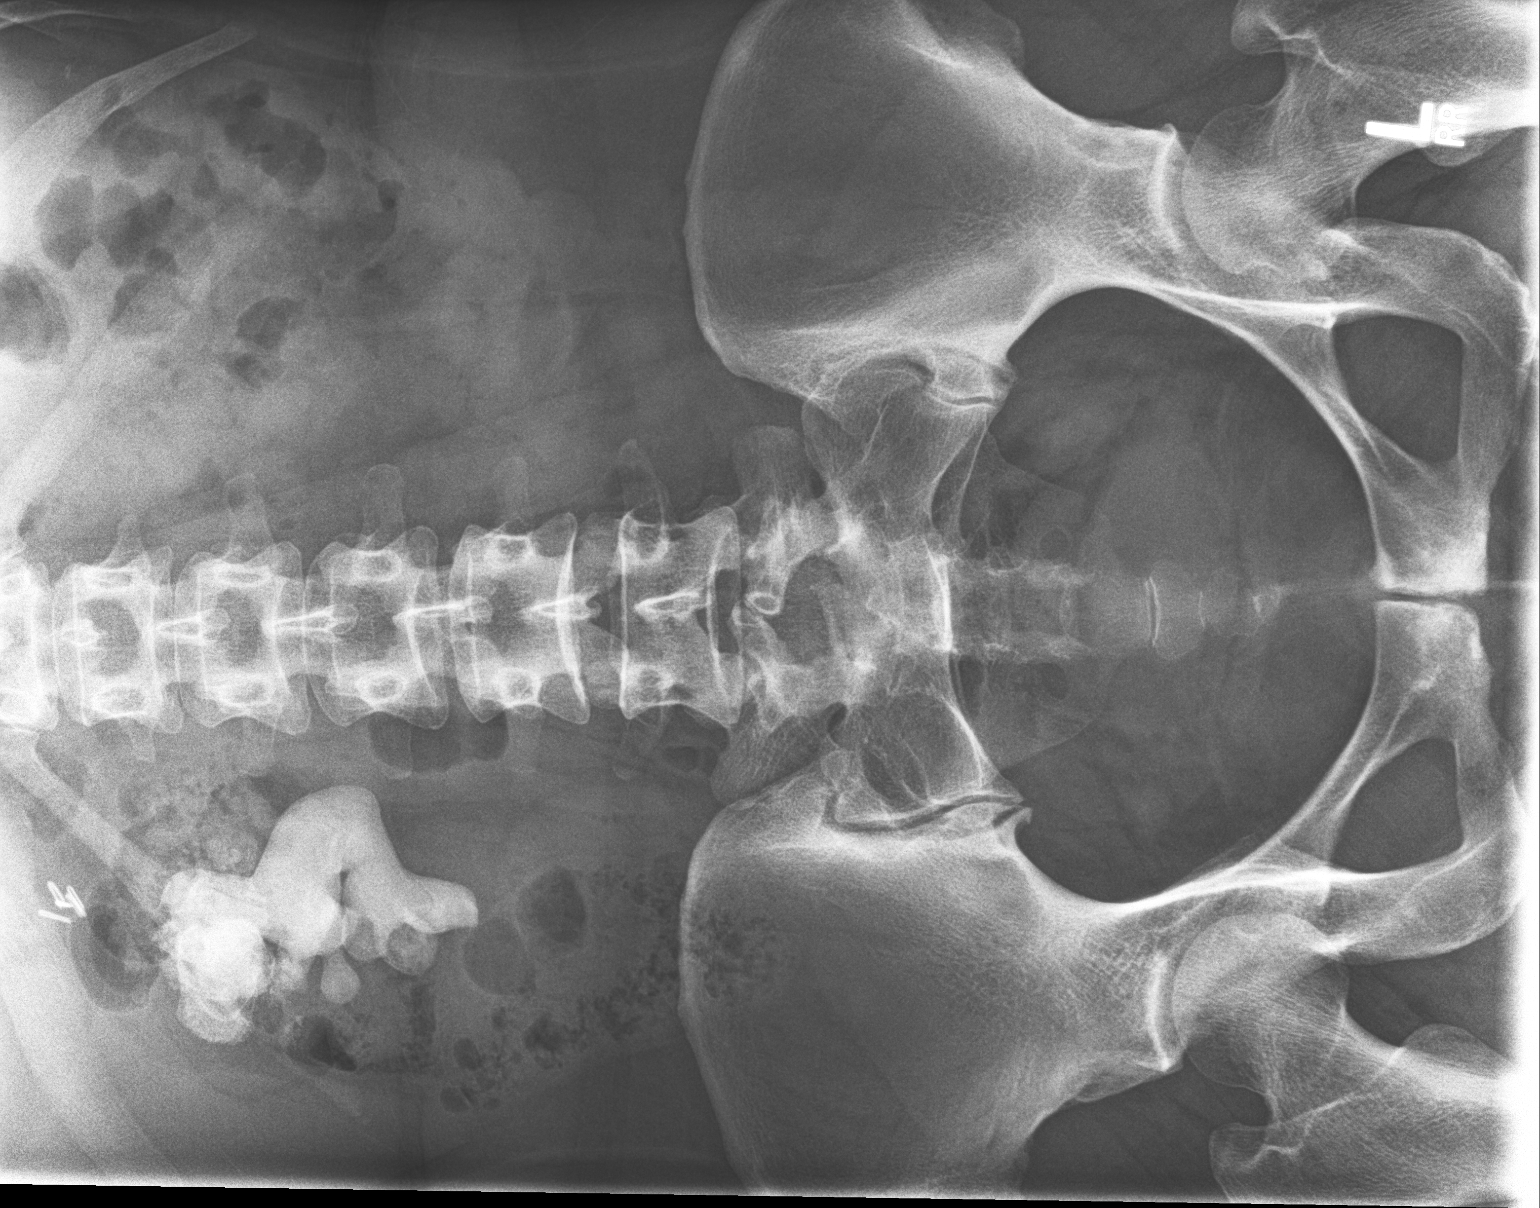

[2 of 2 positions shown; findings below may reference images not displayed]

FINDINGS: Staghorn calculus is identified in the RIGHT kidney. Cholecystectomy
clips. Moderate stool burden. No acute osseous findings.
IMPRESSION: As above.
